# Patient Record
Sex: Male | Born: 1960 | Race: White | Hispanic: No | Marital: Married | Smoking: Former smoker
Health system: Southern US, Community
[De-identification: ages and names within clinical notes are randomized; demographics above are authoritative.]

## PROBLEM LIST (undated history)

## (undated) DIAGNOSIS — K219 Gastro-esophageal reflux disease without esophagitis: Secondary | ICD-10-CM

## (undated) DIAGNOSIS — IMO0001 Reserved for inherently not codable concepts without codable children: Secondary | ICD-10-CM

## (undated) DIAGNOSIS — E785 Hyperlipidemia, unspecified: Secondary | ICD-10-CM

## (undated) DIAGNOSIS — A4902 Methicillin resistant Staphylococcus aureus infection, unspecified site: Secondary | ICD-10-CM

## (undated) HISTORY — DX: Methicillin resistant Staphylococcus aureus infection, unspecified site: A49.02

## (undated) HISTORY — DX: Gastro-esophageal reflux disease without esophagitis: K21.9

## (undated) HISTORY — PX: LARYNGOSCOPY: SUR817

## (undated) HISTORY — DX: Hyperlipidemia, unspecified: E78.5

## (undated) HISTORY — DX: Reserved for inherently not codable concepts without codable children: IMO0001

---

## 2003-04-18 ENCOUNTER — Ambulatory Visit (HOSPITAL_COMMUNITY): Admission: RE | Admit: 2003-04-18 | Discharge: 2003-04-18 | Payer: Self-pay | Admitting: Internal Medicine

## 2004-06-19 ENCOUNTER — Ambulatory Visit (HOSPITAL_COMMUNITY): Admission: RE | Admit: 2004-06-19 | Discharge: 2004-06-19 | Payer: Self-pay | Admitting: Family Medicine

## 2006-05-27 ENCOUNTER — Encounter (INDEPENDENT_AMBULATORY_CARE_PROVIDER_SITE_OTHER): Payer: Self-pay | Admitting: Specialist

## 2006-05-27 ENCOUNTER — Ambulatory Visit: Payer: Self-pay | Admitting: Internal Medicine

## 2006-05-27 ENCOUNTER — Ambulatory Visit (HOSPITAL_COMMUNITY): Admission: RE | Admit: 2006-05-27 | Discharge: 2006-05-27 | Payer: Self-pay | Admitting: Internal Medicine

## 2007-12-24 ENCOUNTER — Ambulatory Visit (HOSPITAL_COMMUNITY): Admission: RE | Admit: 2007-12-24 | Discharge: 2007-12-24 | Payer: Self-pay | Admitting: Family Medicine

## 2010-06-04 ENCOUNTER — Ambulatory Visit (INDEPENDENT_AMBULATORY_CARE_PROVIDER_SITE_OTHER): Payer: BC Managed Care – PPO | Admitting: Internal Medicine

## 2010-06-04 DIAGNOSIS — R1310 Dysphagia, unspecified: Secondary | ICD-10-CM

## 2010-06-29 NOTE — Op Note (Signed)
NAMEAVIV, LENGACHER                ACCOUNT NO.:  [id number]   MEDICAL RECORD NO.:  [id number]          PATIENT TYPE:  AMB   LOCATION:  DAY                           FACILITY:  [facility]   PHYSICIAN:  Lionel December, M.D.    DATE OF BIRTH:  February 05, 1961   DATE OF PROCEDURE:  05/27/2006  DATE OF DISCHARGE:                               OPERATIVE REPORT   PROCEDURE:  Colonoscopy with polypectomy.   INDICATIONS:  The patient is 50 year old Caucasian male whose last  colonoscopy was in March,2005 with removal of three adenomas, one was  over a centimeter.  He is now returning for surveillance colonoscopy.  He does not have any ongoing GI symptoms.  Procedure and risks were  reviewed with the patient, and informed consent was obtained.   MEDICATIONS FOR CONSCIOUS SEDATION:  Demerol 50 mg IV, Versed 5 mg IV.   FINDINGS:  Procedure performed in endoscopy suite.  The patient's vital  signs and O2 saturation were monitored during the procedure and remained  stable.  The patient was placed in left lateral position and rectal  examination performed.  No abnormality noted on external or digital  exam.  Preparation was satisfactory.  The patient's vital signs and O2  saturation were monitored during the procedure and remained stable.  The  patient was placed in left lateral position.  Rectal examination  performed.  No abnormality noted on external or digital exam.  Pentax  videoscope was placed in rectum and advanced under vision into sigmoid  colon and beyond.  Preparation was satisfactory.  Scope was passed into  cecum which was identified by appendiceal orifice and ileocecal valve.  As the scope was with withdrawn, colonic mucosa was carefully examined.  There was a 6-mm sessile polyp at proximal transverse colon which was  snared and retrieved for histologic examination.  Mucosa of the rest of  the colon was normal.  Rectal mucosa similarly was normal.  Scope was  retroflexed to examine anorectal  junction which was unremarkable.  Endoscope was straightened and withdrawn.  The patient tolerated the  procedure well.   FINAL DIAGNOSIS:  A 6-mm sessile polyp snared from proximal transverse  colon.   The rest of the exam was normal.   RECOMMENDATIONS:  No aspirin for 1 week.  I will be contacting the patient with results of biopsy.      Lionel December, M.D.  Electronically Signed     NR/MEDQ  D:  05/27/2006  T:  05/27/2006  Job:  [id number]   cc:   [name], MD  Fax: [phone]

## 2010-06-29 NOTE — Op Note (Signed)
NAME:  Wayne [name], Wayne [name]                          ACCOUNT NO.:  [id number]   MEDICAL RECORD NO.:  [id number]                   PATIENT TYPE:  AMB   LOCATION:  DAY                                  FACILITY:  [facility]   PHYSICIAN:  Lionel December, M.D.                 DATE OF BIRTH:  06-27-1960   DATE OF PROCEDURE:  04/18/2003  DATE OF DISCHARGE:                                 OPERATIVE REPORT   PROCEDURE:  Total colonoscopy with polypectomy.   ENDOSCOPIST:  Lionel December, M.D.   INDICATIONS:  Wayne [name] is a 50 year old Caucasian male who had 2 episodes of  hematochezia recently.  He may have been constipated, but he did not have  any other symptoms.  His family history is positive for colon polyps in his  father.  He is undergoing a diagnostic colonoscopy.  The procedure and risks  were reviewed with the patient and informed consent was obtained.   PREOPERATIVE MEDICATIONS:  Demerol 25 mg IV and Versed 7 mg IV.   FINDINGS:  Procedure performed in endoscopy suite.  The patient's vital  signs and O2 saturation were monitored during the procedure and remained  stable.  The patient was placed in the left lateral recumbent position and  rectal examination was performed.  No abnormality noted on external or  digital exam.   Olympus videoscope was placed in the rectum and advanced under vision into  the sigmoid colon and beyond.  There was a large pedunculated polyp with a  hemorrhagic surface at the distal sigmoid colon which was dealt with on the  way out.  The preparation was satisfactory with a lot of liquid which had to  be suctioned out.  The scope was passed into the cecum which was identified  by appendiceal orifice and the ileocecal valve.  There was a small polyp at  the proximal transverse colon which was ablated by a cold biopsy.  There was  a 5 mm sessile polyp at the distal transverse colon which was snared and  retrieved for histologic examination.  This pedunculated polyp measured  about 12-13 mm. It was on a short, thick stalk.  This was snared and  retrieved using the Dormia basket.  There was another sessile polyp at  rectum which was snared.  The scope was retroflexed to examine the anorectal  junction which was unremarkable.   The endoscope was straightened and withdrawn.  The patient tolerated the  procedure well.   FINAL DIAGNOSIS:  Three colonic polyps snared.  The largest one was 12-to-13  mm at the distal sigmoid colon felt to be the cause of his recent  hematochezia.  The fourth polyp was small and coagulated.   RECOMMENDATIONS:  1. Standard instructions given.  2. I will be contacting the patient with biopsy results and further     recommendations.  3. Given today's findings, I am inclined to Wayne [name] him back  in 3 years, but     will defer final decision until the histology is available for review.      ___________________________________________                                            Lionel December, M.D.   NR/MEDQ  D:  04/18/2003  T:  04/18/2003  Job:  [id number]   cc:   [name], M.D.  [address], [address]  [place]  [place] [address]  Fax: [phone]

## 2010-07-05 ENCOUNTER — Ambulatory Visit (HOSPITAL_COMMUNITY)
Admission: RE | Admit: 2010-07-05 | Discharge: 2010-07-05 | Disposition: A | Discharge status: Home / Self Care | Payer: BC Managed Care – PPO | Source: Ambulatory Visit | Attending: Internal Medicine | Admitting: Internal Medicine

## 2010-07-05 ENCOUNTER — Encounter (HOSPITAL_BASED_OUTPATIENT_CLINIC_OR_DEPARTMENT_OTHER): Payer: BC Managed Care – PPO | Admitting: Internal Medicine

## 2010-07-05 DIAGNOSIS — R1311 Dysphagia, oral phase: Secondary | ICD-10-CM

## 2010-07-05 DIAGNOSIS — K219 Gastro-esophageal reflux disease without esophagitis: Secondary | ICD-10-CM

## 2010-07-05 DIAGNOSIS — K31819 Angiodysplasia of stomach and duodenum without bleeding: Secondary | ICD-10-CM

## 2010-07-05 DIAGNOSIS — K222 Esophageal obstruction: Secondary | ICD-10-CM | POA: Insufficient documentation

## 2010-07-05 DIAGNOSIS — K449 Diaphragmatic hernia without obstruction or gangrene: Secondary | ICD-10-CM | POA: Insufficient documentation

## 2010-07-05 DIAGNOSIS — E785 Hyperlipidemia, unspecified: Secondary | ICD-10-CM | POA: Insufficient documentation

## 2010-07-05 DIAGNOSIS — R1310 Dysphagia, unspecified: Secondary | ICD-10-CM | POA: Insufficient documentation

## 2010-07-05 HISTORY — PX: UPPER GASTROINTESTINAL ENDOSCOPY: SHX188

## 2010-07-30 NOTE — Op Note (Signed)
[name], [name]                ACCOUNT NO.:  [id number]  MEDICAL RECORD NO.:  [id number]           PATIENT TYPE:  O  LOCATION:  DAYP                          FACILITY:  [facility]  PHYSICIAN:  Lionel December, M.D.    DATE OF BIRTH:  1960-11-28  DATE OF PROCEDURE:  07/05/2010 DATE OF DISCHARGE:                              OPERATIVE REPORT   PROCEDURE:  Esophagogastroduodenoscopy with esophageal dilation.  INDICATIONS:  Wayne [name] is a 50 year old Caucasian male who has chronic GERD who has been on intermittent p.r.n. PPI therapy until about a month ago when he was seen in our office and advised to take omeprazole every day. He states he is not experiencing heartburn anymore.  He has had dysphagia off and on for about 2 years, is primarily to solids and he points to his low sternal area site of bolus obstruction.  He is undergoing diagnostic/therapeutic EGD.  Procedure risks were reviewed with the patient and informed consent was obtained.  MEDICATIONS FOR CONSCIOUS SEDATION: 1. Cetacaine spray for pharyngeal topical anesthesia. 2. Demerol 50 mg IV. 3. Versed 7 mg IV in divided dose.  FINDINGS:  Procedure performed in endoscopy suite.  The patient's vital signs and O2 sats were monitored during the procedure and remained stable.  The patient was placed in left lateral recumbent position and Pentax videoscope was passed through oropharynx without any difficulty into esophagus.  Esophagus.  Mucosa of the esophagus normal.  Prominent ring was noted at GE junction which was located at 35-cm from the incisors.  Hiatus was at 38.  Mucosa and herniated part of the stomach was normal.  Stomach.  It was empty and distended very well with insufflation.  Folds of the proximal stomach are normal.  Examination of mucosa at body, antrum, pyloric channel as well as angularis, fundus and cardia was normal.  Duodenum.  In the bulb, there were very fine punctate telangiectasia without stigmata of  bleeding.  No erosions or ulcers were noted.  Scope was passed into second part of the duodenal mucosa and folds were normal.  Endoscope was withdrawn.  Esophageal dilation was performed by passing 56-French Maloney dilator to full insertion.  As the dilators were withdrawn, endoscope was passed again and ring was noted to have been disrupted.  Pictures were taken for the record.  Endoscope was withdrawn.  The patient tolerated the procedure well.  FINAL DIAGNOSES: 1. Schatzki ring disrupted by passing 56-French Maloney dilator. 2. Small sliding hiatal hernia, but no evidence of erosive     esophagitis. 3. Few very fine punctate telangiectasia involving the bulbar mucosa.  RECOMMENDATIONS:  He will continue antireflux measures omeprazole at 40 mg p.o. q.a.m.  If he does well, he may consider dropping the dose to 20 mg after 6 months or so.          ______________________________ Lionel December, M.D.     NR/MEDQ  D:  07/05/2010  T:  07/06/2010  Job:  [id number]  cc:   Manson Passey, M.D. [place] [employer] A. Gerda Diss, MD Fax: [phone]  Electronically Signed by Lionel December M.D. on 07/30/2010 10:18:13 AM

## 2010-10-03 ENCOUNTER — Encounter (INDEPENDENT_AMBULATORY_CARE_PROVIDER_SITE_OTHER): Payer: Self-pay

## 2011-05-15 ENCOUNTER — Encounter (INDEPENDENT_AMBULATORY_CARE_PROVIDER_SITE_OTHER): Payer: Self-pay | Admitting: *Deleted

## 2011-12-04 ENCOUNTER — Other Ambulatory Visit: Payer: Self-pay | Admitting: Family Medicine

## 2011-12-04 ENCOUNTER — Ambulatory Visit (HOSPITAL_COMMUNITY)
Admission: RE | Admit: 2011-12-04 | Discharge: 2011-12-04 | Disposition: A | Discharge status: Home / Self Care | Payer: BC Managed Care – PPO | Source: Ambulatory Visit | Attending: Family Medicine | Admitting: Family Medicine

## 2011-12-04 DIAGNOSIS — R61 Generalized hyperhidrosis: Secondary | ICD-10-CM | POA: Insufficient documentation

## 2011-12-05 ENCOUNTER — Telehealth (INDEPENDENT_AMBULATORY_CARE_PROVIDER_SITE_OTHER): Payer: Self-pay | Admitting: *Deleted

## 2011-12-05 ENCOUNTER — Other Ambulatory Visit (INDEPENDENT_AMBULATORY_CARE_PROVIDER_SITE_OTHER): Payer: Self-pay | Admitting: *Deleted

## 2011-12-05 DIAGNOSIS — Z86010 Personal history of colonic polyps: Secondary | ICD-10-CM

## 2011-12-05 DIAGNOSIS — Z1211 Encounter for screening for malignant neoplasm of colon: Secondary | ICD-10-CM

## 2011-12-05 NOTE — Telephone Encounter (Signed)
Patient needs movi prep 

## 2011-12-06 MED ORDER — PEG-KCL-NACL-NASULF-NA ASC-C 100 G PO SOLR: 1.0000 | Freq: Once | ORAL | Status: DC

## 2012-01-01 ENCOUNTER — Telehealth (INDEPENDENT_AMBULATORY_CARE_PROVIDER_SITE_OTHER): Payer: Self-pay | Admitting: *Deleted

## 2012-01-01 NOTE — Telephone Encounter (Signed)
agree

## 2012-01-01 NOTE — Telephone Encounter (Signed)
  Procedure: tcs  Reason/Indication:  Hx polyps  Has patient had this procedure before?  yes  If so, when, by whom and where?  2008  Is there a family history of colon cancer?  no  Who?  What age when diagnosed?    Is patient diabetic?   no      Does patient have prosthetic heart valve?  no  Do you have a pacemaker?  no  Has patient had joint replacement within last 12 months?  no  Is patient on Coumadin, Plavix and/or Aspirin? no  Medications: bupropion 150 mg bid, pravachol 20 mg daily  Allergies: nkda  Medication Adjustment:   Procedure date & time: 01/29/12 at 100

## 2012-01-21 ENCOUNTER — Encounter (INDEPENDENT_AMBULATORY_CARE_PROVIDER_SITE_OTHER): Payer: Self-pay | Admitting: *Deleted

## 2012-02-20 ENCOUNTER — Telehealth (INDEPENDENT_AMBULATORY_CARE_PROVIDER_SITE_OTHER): Payer: Self-pay | Admitting: *Deleted

## 2012-02-20 NOTE — Telephone Encounter (Signed)
agree

## 2012-02-20 NOTE — Telephone Encounter (Signed)
  Procedure: tcs  Reason/Indication:  Hx polyps  Has patient had this procedure before?  yes  If so, when, by whom and where?  2008  Is there a family history of colon cancer?  no  Who?  What age when diagnosed?    Is patient diabetic?   no      Does patient have prosthetic heart valve?  no  Do you have a pacemaker?  no  Has patient had joint replacement within last 12 months?  no  Is patient on Coumadin, Plavix and/or Aspirin? no  Medications: bupropion 150 mg bid, pravachol 20 mg daily  Allergies: nkda  Medication Adjustment:   Procedure date & time: 03/20/11 at 930

## 2012-03-12 ENCOUNTER — Telehealth (INDEPENDENT_AMBULATORY_CARE_PROVIDER_SITE_OTHER): Payer: Self-pay | Admitting: *Deleted

## 2012-03-12 DIAGNOSIS — Z1211 Encounter for screening for malignant neoplasm of colon: Secondary | ICD-10-CM

## 2012-03-12 MED ORDER — PEG-KCL-NACL-NASULF-NA ASC-C 100 G PO SOLR: 1.0000 | Freq: Once | ORAL | Status: DC

## 2012-03-12 NOTE — Telephone Encounter (Signed)
Please resend, patient states pharmacy never got RX

## 2012-03-18 ENCOUNTER — Other Ambulatory Visit (INDEPENDENT_AMBULATORY_CARE_PROVIDER_SITE_OTHER): Payer: Self-pay | Admitting: *Deleted

## 2012-03-18 DIAGNOSIS — Z86010 Personal history of colonic polyps: Secondary | ICD-10-CM

## 2012-03-19 ENCOUNTER — Encounter (HOSPITAL_COMMUNITY)
Admission: RE | Disposition: A | Discharge status: Home / Self Care | Payer: Self-pay | Source: Ambulatory Visit | Attending: Internal Medicine

## 2012-03-19 ENCOUNTER — Encounter (HOSPITAL_COMMUNITY): Payer: Self-pay

## 2012-03-19 ENCOUNTER — Ambulatory Visit (HOSPITAL_COMMUNITY)
Admission: RE | Admit: 2012-03-19 | Discharge: 2012-03-19 | Disposition: A | Discharge status: Home / Self Care | Payer: BC Managed Care – PPO | Source: Ambulatory Visit | Attending: Internal Medicine | Admitting: Internal Medicine

## 2012-03-19 DIAGNOSIS — D126 Benign neoplasm of colon, unspecified: Secondary | ICD-10-CM

## 2012-03-19 DIAGNOSIS — K5730 Diverticulosis of large intestine without perforation or abscess without bleeding: Secondary | ICD-10-CM | POA: Insufficient documentation

## 2012-03-19 DIAGNOSIS — Z1211 Encounter for screening for malignant neoplasm of colon: Secondary | ICD-10-CM

## 2012-03-19 DIAGNOSIS — Z86010 Personal history of colonic polyps: Secondary | ICD-10-CM

## 2012-03-19 HISTORY — PX: COLONOSCOPY: SHX5424

## 2012-03-19 SURGERY — COLONOSCOPY
Anesthesia: Moderate Sedation

## 2012-03-19 MED ORDER — MIDAZOLAM HCL 5 MG/5ML IJ SOLN
INTRAMUSCULAR | Status: DC | PRN
  Administered 2012-03-19 (×2): 2 mg via INTRAVENOUS

## 2012-03-19 MED ORDER — MIDAZOLAM HCL 5 MG/5ML IJ SOLN
INTRAMUSCULAR | Status: AC
  Filled 2012-03-19: qty 10

## 2012-03-19 MED ORDER — SODIUM CHLORIDE 0.45 % IV SOLN
INTRAVENOUS | Status: DC
  Administered 2012-03-19: 09:00:00 via INTRAVENOUS

## 2012-03-19 MED ORDER — STERILE WATER FOR IRRIGATION IR SOLN
Status: DC | PRN
  Administered 2012-03-19: 09:00:00

## 2012-03-19 MED ORDER — MEPERIDINE HCL 50 MG/ML IJ SOLN
INTRAMUSCULAR | Status: AC
  Filled 2012-03-19: qty 1

## 2012-03-19 MED ORDER — MEPERIDINE HCL 50 MG/ML IJ SOLN
INTRAMUSCULAR | Status: DC | PRN
  Administered 2012-03-19: 25 mg via INTRAVENOUS

## 2012-03-19 NOTE — H&P (Signed)
Wayne [name] is an 52 y.o. male.   Chief Complaint: Patient is here for colonoscopy. HPI: Patient is a-year-old Caucasian male who is here for screening colonoscopy. He denies abdominal pain change in his bowel habits or rectal bleeding. Family history is negative for colorectal carcinoma. He gained 20 pounds in the last 5 months after he quit cigarette smoking and now he is trying to lose it.  Past Medical History  Diagnosis Date  . Dysphagia   . Reflux     Past Surgical History  Procedure Date  . Laryngoscopy   . Upper gastrointestinal endoscopy 07/05/2010    EGD ED    History reviewed. No pertinent family history. Social History:  does not have a smoking history on file. He does not have any smokeless tobacco history on file. His alcohol and drug histories not on file.  Allergies: No Known Allergies  Medications Prior to Admission  Medication Sig Dispense Refill  . buPROPion (WELLBUTRIN SR) 150 MG 12 hr tablet Take 150 mg by mouth 2 (two) times daily.      . halobetasol (ULTRAVATE) 0.05 % cream Apply 1 application topically 3 (three) times a week. Prn eczema flares.      . peg 3350 powder (MOVIPREP) 100 G SOLR Take 1 kit (100 g total) by mouth once.  1 kit  0  . pravastatin (PRAVACHOL) 20 MG tablet Take 20 mg by mouth daily.          No results found for this or any previous visit (from the past 48 hour(s)). No results found.  ROS  Blood pressure 102/62, pulse 76, temperature 98.6 F (37 C), temperature source Oral, resp. rate 22, height 5\' 6"  (1.676 m), weight 175 lb (79.379 kg), SpO2 99.00%. Physical Exam  Constitutional: He appears well-developed and well-nourished.  HENT:  Mouth/Throat: Oropharynx is clear and moist.  Eyes: Conjunctivae normal are normal. No scleral icterus.  Neck: No thyromegaly present.  Cardiovascular: Normal rate, regular rhythm and normal heart sounds.   Respiratory: Effort normal.  GI: Soft. He exhibits no distension and no mass. There is  no tenderness.  Musculoskeletal: He exhibits no edema.  Lymphadenopathy:    He has no cervical adenopathy.  Neurological: He is alert.  Skin: Skin is warm and dry.     Assessment/Plan Average risk screening colonoscopy.  REHMAN,NAJEEB U 03/19/2012, 9:21 AM

## 2012-03-19 NOTE — Op Note (Signed)
COLONOSCOPY PROCEDURE REPORT  PATIENT:  EDOARDO LAFORTE  MR#:  [id number] Birthdate:  May 25, 1960, 52 y.o., male Endoscopist:  Dr. Malissa Hippo, MD Referred By:  Dr. Lilyan Punt, MD  Procedure Date: 03/19/2012  Procedure:   Colonoscopy  Indications:  Patient is 52 year old Caucasian male was undergoing average risk screening colonoscopy.  Informed Consent:  The procedure and risks were reviewed with the patient and informed consent was obtained.  Medications:  Demerol 25 mg IV Versed 4 mg IV  Description of procedure:  After a digital rectal exam was performed, that colonoscope was advanced from the anus through the rectum and colon to the area of the cecum, ileocecal valve and appendiceal orifice. The cecum was deeply intubated. These structures were well-seen and photographed for the record. From the level of the cecum and ileocecal valve, the scope was slowly and cautiously withdrawn. The mucosal surfaces were carefully surveyed utilizing scope tip to flexion to facilitate fold flattening as needed. The scope was pulled down into the rectum where a thorough exam including retroflexion was performed.  Findings:   Prep satisfactory. Three small polyps at hepatic flexure. These are ablated via cold biopsy and submitted together. Few small diverticula at sigmoid colon. Normal rectal mucosa and anal rectal junction.  Therapeutic/Diagnostic Maneuvers Performed:  See above  Complications:  None  Cecal Withdrawal Time:  13  minutes  Impression:  Examination performed to cecum. Three small polyps ablated via cold biopsy from hepatic flexure and submitted together. Mild sigmoid colon diverticulosis.  Recommendations:  Standard instructions given. I will contact patient with biopsy results and further recommendations.  Damiana Berrian U  03/19/2012 10:03 AM  CC: Dr. Lilyan Punt, MD & Dr. Bonnetta Barry ref. provider found

## 2012-03-24 ENCOUNTER — Encounter (HOSPITAL_COMMUNITY): Payer: Self-pay | Admitting: Internal Medicine

## 2012-03-30 ENCOUNTER — Encounter (INDEPENDENT_AMBULATORY_CARE_PROVIDER_SITE_OTHER): Payer: Self-pay | Admitting: *Deleted

## 2012-05-11 ENCOUNTER — Ambulatory Visit (INDEPENDENT_AMBULATORY_CARE_PROVIDER_SITE_OTHER): Payer: BC Managed Care – PPO | Admitting: Family Medicine

## 2012-05-11 ENCOUNTER — Encounter: Payer: Self-pay | Admitting: Family Medicine

## 2012-05-11 VITALS — BP 112/80 | Temp 99.0°F | Ht 66.0 in | Wt 175.6 lb

## 2012-05-11 DIAGNOSIS — E785 Hyperlipidemia, unspecified: Secondary | ICD-10-CM

## 2012-05-11 DIAGNOSIS — J0111 Acute recurrent frontal sinusitis: Secondary | ICD-10-CM

## 2012-05-11 DIAGNOSIS — J0110 Acute frontal sinusitis, unspecified: Secondary | ICD-10-CM

## 2012-05-11 MED ORDER — AZITHROMYCIN 250 MG PO TABS: ORAL_TABLET | ORAL | Status: DC

## 2012-05-11 NOTE — Progress Notes (Signed)
  Subjective:    Patient ID: Wayne [name], male    DOB: 09-27-1960, 52 y.o.   MRN: [id number]  Sinusitis This is a new problem. The current episode started in the past 7 days. The problem has been gradually worsening since onset. There has been no fever. The pain is mild. Associated symptoms include congestion, coughing, sinus pressure and a sore throat. Pertinent negatives include no ear pain, headaches or neck pain. (Myalgias) Past treatments include nothing. The treatment provided no relief.      Review of Systems  Constitutional: Negative for fever, activity change and appetite change.  HENT: Positive for congestion, sore throat and sinus pressure. Negative for ear pain, rhinorrhea and neck pain.   Eyes: Negative for discharge.  Respiratory: Positive for cough. Negative for wheezing.   Cardiovascular: Negative for chest pain.  Gastrointestinal: Negative for vomiting and abdominal pain.  Skin: Negative for rash.  Allergic/Immunologic: Negative for environmental allergies and food allergies.  Neurological: Negative for weakness and headaches.  Psychiatric/Behavioral: Negative for agitation.       Objective:   Physical Exam  Vital signs were noted eardrums normal sinus mild frontal sinus tenderness naris and normal throat normal makes membranes moist neck supple lungs clear no wheezes no respiratory distress      Assessment & Plan:  Acute recurrent frontal sinusitis - Plan: azithromycin (ZITHROMAX Z-PAK) 250 MG tablet

## 2012-05-11 NOTE — Patient Instructions (Signed)

## 2012-07-20 ENCOUNTER — Encounter: Payer: Self-pay | Admitting: Family Medicine

## 2012-07-20 ENCOUNTER — Ambulatory Visit (INDEPENDENT_AMBULATORY_CARE_PROVIDER_SITE_OTHER): Payer: BC Managed Care – PPO | Admitting: Family Medicine

## 2012-07-20 VITALS — BP 110/80 | Temp 98.3°F | Ht 66.0 in | Wt 180.1 lb

## 2012-07-20 DIAGNOSIS — L0390 Cellulitis, unspecified: Secondary | ICD-10-CM

## 2012-07-20 MED ORDER — DOXYCYCLINE HYCLATE 100 MG PO TABS: 100.0000 mg | ORAL_TABLET | Freq: Two times a day (BID) | ORAL | Status: DC

## 2012-07-20 NOTE — Patient Instructions (Signed)
Take all the antibiotics. Report any worsening or fever

## 2012-07-20 NOTE — Progress Notes (Signed)
  Subjective:    Patient ID: Wayne [name], male    DOB: [birth year], 52 y.o.   MRN: [id number]  Toe Pain   Rash This is a chronic problem. The current episode started more than 1 year ago. The problem has been rapidly worsening since onset. The affected locations include the left foot. The rash is characterized by pain, redness and swelling. Past treatments include topical steroids. The treatment provided no relief.   Now patient has had progressive pain in the foot. Started as a crack between scan. Told in the past that he has chronic eczema. No fever or chills. Prior medical history significant for past MRSA infection.   Review of Systems  Skin: Positive for rash.   ROS otherwise negative     Objective:   Physical Exam Alert no acute distress. Vitals reviewed. Lungs clear heart regular rate and rhythm. Feet changes of chronic eczema and noted. Maceration and crack between scan of fourth and fifth toe secondary inflammation erythema tenderness.       Assessment & Plan:  Impression cellulitis at site of eczema. Positive history of MRSA. Dyshidrotic eczema changes lateral foot Question fungal component. Plan Doxy 100 twice a day 10 days. Local measures discussed. Maintain clobetasol. To followup in August if maceration and chronic changes persist may need antifungal regimen.

## 2012-07-24 ENCOUNTER — Telehealth: Payer: Self-pay | Admitting: Family Medicine

## 2012-07-24 ENCOUNTER — Ambulatory Visit (INDEPENDENT_AMBULATORY_CARE_PROVIDER_SITE_OTHER): Payer: BC Managed Care – PPO | Admitting: Family Medicine

## 2012-07-24 ENCOUNTER — Encounter: Payer: Self-pay | Admitting: Family Medicine

## 2012-07-24 VITALS — BP 100/70 | Temp 97.6°F | Ht 66.0 in | Wt 182.2 lb

## 2012-07-24 DIAGNOSIS — L0390 Cellulitis, unspecified: Secondary | ICD-10-CM

## 2012-07-24 MED ORDER — SULFAMETHOXAZOLE-TRIMETHOPRIM 800-160 MG PO TABS: 1.0000 | ORAL_TABLET | Freq: Two times a day (BID) | ORAL | Status: AC

## 2012-07-24 NOTE — Telephone Encounter (Signed)
Was seen on Monday July 20, 2012 regarding a infection on his left foot.  States this situation seems to be worse today.  States entire foot is swollen, red and sore.  Patient would like to know if he needs to be seen again today or does his antibiotic need to be changed.  Informed that he is off work today and to call him as soon as possible to let him know if he needs to make an appointment to be seen.

## 2012-07-24 NOTE — Progress Notes (Signed)
  Subjective:    Patient ID: Wayne [name], male    DOB: 1960-04-19, 52 y.o.   MRN: [id number]  HPI Patient arrives office for reevaluation. Please see prior note. He has now been on the Doxy for several days. Notes that the pain appears to be worsening. Notes swelling redness and tenderness. Compliant with his medicines. Elevating his foot when he can, but he does have to work the next few days.   Review of Systems No fever, no chills, no known injury, no rash elsewhere ROS otherwise negative    Objective:   Physical Exam  Alert no major distress. Vitals stable. Lungs clear. Heart regular in rhythm. Foot inflamed tender warm red minimal change from several days ago.      Assessment & Plan:  Impression 1 cellulitis persistent. Patient notes history of MRSA, wonders if this could be a resistant organism. Plan add Bactrim DS twice a day 10 days. Local measures discussed. At followup if still appears to have tenia pedis in the intertrigo regions, may well want to consider an oral agent for that. WSL

## 2012-07-24 NOTE — Patient Instructions (Addendum)
Consider oral med for chronic fungal infxn of the feet such as lamisil

## 2012-07-24 NOTE — Telephone Encounter (Signed)
Patient scheduled office visit today for recheck 

## 2012-07-24 NOTE — Telephone Encounter (Signed)
Office visit scheduled for follow up

## 2012-08-26 ENCOUNTER — Encounter: Payer: Self-pay | Admitting: Family Medicine

## 2012-08-26 ENCOUNTER — Ambulatory Visit (INDEPENDENT_AMBULATORY_CARE_PROVIDER_SITE_OTHER): Payer: BC Managed Care – PPO | Admitting: Family Medicine

## 2012-08-26 VITALS — BP 114/70 | Temp 98.1°F | Wt 183.0 lb

## 2012-08-26 DIAGNOSIS — H1045 Other chronic allergic conjunctivitis: Secondary | ICD-10-CM

## 2012-08-26 DIAGNOSIS — M76899 Other specified enthesopathies of unspecified lower limb, excluding foot: Secondary | ICD-10-CM

## 2012-08-26 DIAGNOSIS — H1013 Acute atopic conjunctivitis, bilateral: Secondary | ICD-10-CM

## 2012-08-26 DIAGNOSIS — M7071 Other bursitis of hip, right hip: Secondary | ICD-10-CM

## 2012-08-26 DIAGNOSIS — L0390 Cellulitis, unspecified: Secondary | ICD-10-CM

## 2012-08-26 DIAGNOSIS — L0291 Cutaneous abscess, unspecified: Secondary | ICD-10-CM

## 2012-08-26 MED ORDER — DICLOFENAC SODIUM 75 MG PO TBEC: 75.0000 mg | DELAYED_RELEASE_TABLET | Freq: Two times a day (BID) | ORAL | Status: DC

## 2012-08-26 MED ORDER — DOXYCYCLINE HYCLATE 100 MG PO TBEC: 100.0000 mg | DELAYED_RELEASE_TABLET | Freq: Two times a day (BID) | ORAL | Status: DC

## 2012-08-26 MED ORDER — SULFAMETHOXAZOLE-TMP DS 800-160 MG PO TABS: 1.0000 | ORAL_TABLET | Freq: Two times a day (BID) | ORAL | Status: DC

## 2012-08-26 NOTE — Progress Notes (Signed)
  Subjective:    Patient ID: Wayne [name], male    DOB: 1961-01-05, 52 y.o.   MRN: [id number]  HPI Toe cleared up. See prior notes.  Feeling tired and achey. Hips hurting for two or three weeks. Acted up after driving long distance. Patient recalls no sudden injury.  Had bug bite. Developed subsequent itching pain and swelling. Eyes the swelling considerably.  Hx of MRSA. Worse las few days . Left leg lesion red and tender.  Eyes puffy, no upper resp symptoms.    Review of Systems No fever or chills, somewhat achy, diminished energy otherwise negative.    Objective:   Physical Exam Alert TMs normal eyes sclera injected eyelids puffy. Pharynx normal neck supple. Lungs clear. Heart regular in rhythm. Low back nontender. Posterior hip tenderness to deep palpation negative straight leg raise good rotation internally and externally. Left leg 3 small insect bites the middle one is inflamed tender or draining       Assessment & Plan:  Impression probable MRSA cellulitis post bites. #2 posterior hip bursitis discussed. #3 probable high edema secondary to allergic reaction. Plan Claritin daily. Visine drops when necessary. Voltaren 75 twice a day. Local measures discussed. Bactrim DS and doxycycline prescribed. Symptomatic care discussed. WSL

## 2012-08-26 NOTE — Patient Instructions (Signed)
Claritin 10mg  OTC and Visine drops

## 2012-08-29 ENCOUNTER — Inpatient Hospital Stay (HOSPITAL_COMMUNITY)
Admission: EM | Admit: 2012-08-29 | Discharge: 2012-08-31 | DRG: 563 | Disposition: A | Discharge status: Home / Self Care | Payer: BC Managed Care – PPO | Attending: Internal Medicine | Admitting: Internal Medicine

## 2012-08-29 ENCOUNTER — Encounter (HOSPITAL_COMMUNITY): Payer: Self-pay

## 2012-08-29 DIAGNOSIS — N179 Acute kidney failure, unspecified: Secondary | ICD-10-CM | POA: Diagnosis present

## 2012-08-29 DIAGNOSIS — L03116 Cellulitis of left lower limb: Secondary | ICD-10-CM | POA: Diagnosis present

## 2012-08-29 DIAGNOSIS — Z87891 Personal history of nicotine dependence: Secondary | ICD-10-CM

## 2012-08-29 DIAGNOSIS — K219 Gastro-esophageal reflux disease without esophagitis: Secondary | ICD-10-CM | POA: Diagnosis present

## 2012-08-29 DIAGNOSIS — S90569A Insect bite (nonvenomous), unspecified ankle, initial encounter: Secondary | ICD-10-CM | POA: Diagnosis present

## 2012-08-29 DIAGNOSIS — Z79899 Other long term (current) drug therapy: Secondary | ICD-10-CM

## 2012-08-29 DIAGNOSIS — L02419 Cutaneous abscess of limb, unspecified: Principal | ICD-10-CM | POA: Diagnosis present

## 2012-08-29 DIAGNOSIS — E785 Hyperlipidemia, unspecified: Secondary | ICD-10-CM

## 2012-08-29 DIAGNOSIS — L02416 Cutaneous abscess of left lower limb: Secondary | ICD-10-CM

## 2012-08-29 DIAGNOSIS — L03119 Cellulitis of unspecified part of limb: Principal | ICD-10-CM | POA: Diagnosis present

## 2012-08-29 LAB — CBC WITH DIFFERENTIAL/PLATELET
Basophils Absolute: 0 K/uL (ref 0.0–0.1)
Basophils Relative: 0 % (ref 0–1)
Eosinophils Absolute: 0.1 K/uL (ref 0.0–0.7)
Eosinophils Relative: 2 % (ref 0–5)
HCT: 39.9 % (ref 39.0–52.0)
Hemoglobin: 13.9 g/dL (ref 13.0–17.0)
Lymphocytes Relative: 15 % (ref 12–46)
Lymphs Abs: 0.7 K/uL (ref 0.7–4.0)
MCH: 30.3 pg (ref 26.0–34.0)
MCHC: 34.8 g/dL (ref 30.0–36.0)
MCV: 86.9 fL (ref 78.0–100.0)
Monocytes Absolute: 0.6 K/uL (ref 0.1–1.0)
Monocytes Relative: 12 % (ref 3–12)
Neutro Abs: 3.5 K/uL (ref 1.7–7.7)
Neutrophils Relative %: 71 % (ref 43–77)
Platelets: 190 K/uL (ref 150–400)
RBC: 4.59 MIL/uL (ref 4.22–5.81)
RDW: 13 % (ref 11.5–15.5)
WBC: 4.9 K/uL (ref 4.0–10.5)

## 2012-08-29 LAB — MRSA PCR SCREENING: MRSA by PCR: NEGATIVE

## 2012-08-29 LAB — BASIC METABOLIC PANEL WITH GFR
BUN: 21 mg/dL (ref 6–23)
CO2: 28 meq/L (ref 19–32)
Calcium: 9.4 mg/dL (ref 8.4–10.5)
Chloride: 102 meq/L (ref 96–112)
Creatinine, Ser: 1.4 mg/dL — ABNORMAL HIGH (ref 0.50–1.35)
GFR calc Af Amer: 65 mL/min — ABNORMAL LOW
GFR calc non Af Amer: 56 mL/min — ABNORMAL LOW
Glucose, Bld: 89 mg/dL (ref 70–99)
Potassium: 3.9 meq/L (ref 3.5–5.1)
Sodium: 139 meq/L (ref 135–145)

## 2012-08-29 MED ORDER — VANCOMYCIN HCL IN DEXTROSE 1-5 GM/200ML-% IV SOLN
1000.0000 mg | Freq: Once | INTRAVENOUS | Status: AC
  Administered 2012-08-29: 1000 mg via INTRAVENOUS
  Filled 2012-08-29: qty 200

## 2012-08-29 MED ORDER — BUPROPION HCL ER (SR) 150 MG PO TB12
ORAL_TABLET | ORAL | Status: AC
  Filled 2012-08-29: qty 1

## 2012-08-29 MED ORDER — ONDANSETRON HCL 4 MG PO TABS: 4.0000 mg | ORAL_TABLET | Freq: Four times a day (QID) | ORAL | Status: DC | PRN

## 2012-08-29 MED ORDER — LORATADINE 10 MG PO TABS
10.0000 mg | ORAL_TABLET | Freq: Every day | ORAL | Status: DC
  Administered 2012-08-30 – 2012-08-31 (×2): 10 mg via ORAL
  Filled 2012-08-29 (×3): qty 1

## 2012-08-29 MED ORDER — SODIUM CHLORIDE 0.9 % IV SOLN
INTRAVENOUS | Status: DC
  Administered 2012-08-29 – 2012-08-31 (×3): via INTRAVENOUS

## 2012-08-29 MED ORDER — VANCOMYCIN HCL IN DEXTROSE 1-5 GM/200ML-% IV SOLN
INTRAVENOUS | Status: AC
  Filled 2012-08-29: qty 200

## 2012-08-29 MED ORDER — ONDANSETRON HCL 4 MG/2ML IJ SOLN: 4.0000 mg | Freq: Four times a day (QID) | INTRAMUSCULAR | Status: DC | PRN

## 2012-08-29 MED ORDER — ACETAMINOPHEN 325 MG PO TABS: 650.0000 mg | ORAL_TABLET | Freq: Four times a day (QID) | ORAL | Status: DC | PRN

## 2012-08-29 MED ORDER — ENOXAPARIN SODIUM 40 MG/0.4ML ~~LOC~~ SOLN
40.0000 mg | SUBCUTANEOUS | Status: DC
  Administered 2012-08-29 – 2012-08-30 (×2): 40 mg via SUBCUTANEOUS
  Filled 2012-08-29 (×3): qty 0.4

## 2012-08-29 MED ORDER — PIPERACILLIN-TAZOBACTAM 3.375 G IVPB
INTRAVENOUS | Status: AC
  Filled 2012-08-29: qty 100

## 2012-08-29 MED ORDER — PIPERACILLIN-TAZOBACTAM 3.375 G IVPB
3.3750 g | Freq: Three times a day (TID) | INTRAVENOUS | Status: DC
  Administered 2012-08-29 – 2012-08-31 (×6): 3.375 g via INTRAVENOUS
  Filled 2012-08-29 (×9): qty 50

## 2012-08-29 MED ORDER — SIMVASTATIN 10 MG PO TABS
10.0000 mg | ORAL_TABLET | Freq: Every day | ORAL | Status: DC
  Administered 2012-08-29 – 2012-08-30 (×2): 10 mg via ORAL
  Filled 2012-08-29 (×2): qty 1

## 2012-08-29 MED ORDER — BUPROPION HCL ER (SR) 150 MG PO TB12
150.0000 mg | ORAL_TABLET | Freq: Two times a day (BID) | ORAL | Status: DC
  Administered 2012-08-29 – 2012-08-31 (×4): 150 mg via ORAL
  Filled 2012-08-29 (×8): qty 1

## 2012-08-29 MED ORDER — VANCOMYCIN HCL IN DEXTROSE 1-5 GM/200ML-% IV SOLN
1000.0000 mg | Freq: Two times a day (BID) | INTRAVENOUS | Status: DC
  Administered 2012-08-30 – 2012-08-31 (×3): 1000 mg via INTRAVENOUS
  Filled 2012-08-29 (×5): qty 200

## 2012-08-29 MED ORDER — ACETAMINOPHEN 650 MG RE SUPP: 650.0000 mg | Freq: Four times a day (QID) | RECTAL | Status: DC | PRN

## 2012-08-29 NOTE — Progress Notes (Signed)
Dr. Kerry Hough notified via text page that patient had arrived on unit to room 328.
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
## 2012-08-29 NOTE — ED Notes (Signed)
Pt states he has an infection to his left lower leg. Was evaluated by Dr. Gerda Diss on Wednesday on given doxy and bactrim. States leg is getting worse
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
## 2012-08-30 LAB — BASIC METABOLIC PANEL
BUN: 16 mg/dL (ref 6–23)
CO2: 28 mEq/L (ref 19–32)
Calcium: 9 mg/dL (ref 8.4–10.5)
Chloride: 103 mEq/L (ref 96–112)
Creatinine, Ser: 1.43 mg/dL — ABNORMAL HIGH (ref 0.50–1.35)
Glucose, Bld: 116 mg/dL — ABNORMAL HIGH (ref 70–99)
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
## 2012-08-30 NOTE — Progress Notes (Signed)
[facility] PROGRESS NOTE  SHAFIQ LARCH MVH:[id number] DOB: July 24, 1960 DOA: 08/29/2012 PCP: Lilyan Punt, MD  Assessment/Plan: 1. Cellulitis. We'll continue with current antibiotics. He continues to have significant erythema, calor in his left lower leg. We'll continue current therapy and observe. 2. Acute renal failure. Creatinine remains 1.4. His pain to be a chronic process. We'll continue fluids and observe.  3. Hyperlipidemia. Continue statin  Code Status: Full code Family Communication: Discussed with patient  Disposition Plan: Discharge home once improved   Consultants:  None  Procedures:  None  Antibiotics:  Vancomycin 7/19  Zosyn 7/19   HPI/Subjective: Feels that leg is a little better today. No new complaints  Objective: Filed Vitals:   08/29/12 1726 08/29/12 2148 08/30/12 0516 08/30/12 1401  BP: 105/70 107/65 96/63 103/68  Pulse: 78 87 82 80  Temp: 97.9 F (36.6 C) 98.1 F (36.7 C) 98.1 F (36.7 C) 98.5 F (36.9 C)  TempSrc: Oral     Resp: 20 20 18 20   Height: 5\' 6"  (1.676 m)     Weight: 83.008 kg (183 lb)     SpO2: 97% 97% 98% 97%    Intake/Output Summary (Last 24 hours) at 08/30/12 1903 Last data filed at 08/30/12 0513  Gross per 24 hour  Intake 1645.83 ml  Output      0 ml  Net 1645.83 ml   Filed Weights   08/29/12 1340 08/29/12 1726  Weight: 83.008 kg (183 lb) 83.008 kg (183 lb)    Exam:   General:  No acute distress  Cardiovascular: S1, S2, regular rate and rhythm  Respiratory: Clear to auscultation bilaterally  Abdomen: Soft, nontender, positive bowel sounds  Musculoskeletal: No pedal edema bilaterally   Skin: Continued erythema on left lower leg around the wound. Serous drainage noted from wound. No purulent drainage expressed.  Data Reviewed: Basic Metabolic Panel:  Recent Labs Lab 08/29/12 1444 08/30/12 0618  NA 139 138  K 3.9 4.4  CL 102 103  CO2 28 28  GLUCOSE 89 116*  BUN 21 16  CREATININE 1.40*  1.43*  CALCIUM 9.4 9.0   Liver Function Tests: No results found for this basename: AST, ALT, ALKPHOS, BILITOT, PROT, ALBUMIN,  in the last 168 hours No results found for this basename: LIPASE, AMYLASE,  in the last 168 hours No results found for this basename: AMMONIA,  in the last 168 hours CBC:  Recent Labs Lab 08/29/12 1444  WBC 4.9  NEUTROABS 3.5  HGB 13.9  HCT 39.9  MCV 86.9  PLT 190   Cardiac Enzymes: No results found for this basename: CKTOTAL, CKMB, CKMBINDEX, TROPONINI,  in the last 168 hours BNP (last 3 results) No results found for this basename: PROBNP,  in the last 8760 hours CBG: No results found for this basename: GLUCAP,  in the last 168 hours  Recent Results (from the past 240 hour(s))  MRSA PCR SCREENING     Status: None   Collection Time    08/29/12  7:39 PM      Result Value Range Status   MRSA by PCR NEGATIVE  NEGATIVE Final   Comment:            The GeneXpert MRSA Assay (FDA     approved for NASAL specimens     only), is one component of a     comprehensive MRSA colonization     surveillance program. It is not     intended to diagnose MRSA     infection nor  to guide or     monitor treatment for     MRSA infections.     Studies: No results found.  Scheduled Meds: . buPROPion  150 mg Oral BID  . enoxaparin (LOVENOX) injection  40 mg Subcutaneous Q24H  . loratadine  10 mg Oral Daily  . piperacillin-tazobactam (ZOSYN)  IV  3.375 g Intravenous Q8H  . simvastatin  10 mg Oral q1800  . vancomycin  1,000 mg Intravenous Q12H   Continuous Infusions: . sodium chloride 125 mL/hr at 08/30/12 1047    Active Problems:   Other and unspecified hyperlipidemia   Cellulitis of left lower leg   Acute renal failure    Time spent: 25 minutes    Talicia Sui  [facility] Pager [phone]. If 7PM-7AM, please contact night-coverage at [email or web address], password [facility] 08/30/2012, 7:03 PM  LOS: 1 day

## 2012-08-31 LAB — BASIC METABOLIC PANEL
BUN: 15 mg/dL (ref 6–23)
CO2: 27 mEq/L (ref 19–32)
Chloride: 105 mEq/L (ref 96–112)
Creatinine, Ser: 1.43 mg/dL — ABNORMAL HIGH (ref 0.50–1.35)
Glucose, Bld: 107 mg/dL — ABNORMAL HIGH (ref 70–99)

## 2012-08-31 MED ORDER — SACCHAROMYCES BOULARDII 250 MG PO CAPS: 250.0000 mg | ORAL_CAPSULE | Freq: Two times a day (BID) | ORAL | Status: DC

## 2012-08-31 MED ORDER — CLINDAMYCIN HCL 300 MG PO CAPS: 300.0000 mg | ORAL_CAPSULE | Freq: Four times a day (QID) | ORAL | Status: DC

## 2012-08-31 MED ORDER — ACETAMINOPHEN 500 MG PO TABS: 500.0000 mg | ORAL_TABLET | Freq: Four times a day (QID) | ORAL | Status: DC | PRN

## 2012-08-31 NOTE — Progress Notes (Signed)
Patient discharged home instructions were given on discharge medications and follow up visits,patient verbalized understanding. Handout given on cellulitis,know signs,and symptoms,also when to seek medical care. Prescriptions sent with patient. No c/o pain or discomfort noted. Accompanied by staff to an awaiting vehicle.

## 2012-08-31 NOTE — Discharge Summary (Signed)
Physician Discharge Summary  Wayne [name]:[id number] DOB: 09/19/1960 DOA: 08/29/2012  PCP: Lilyan Punt, MD  Admit date: 08/29/2012 Discharge date: 08/31/2012  Time spent: 40 minutes  Recommendations for Outpatient Follow-up:  1. PCP in 1 week for evaluation of symptoms. Recommend BMET for evaluation of renal function.  Discharge Diagnoses:  Active Problems:   Other and unspecified hyperlipidemia   Cellulitis of left lower leg   Acute renal failure   Discharge Condition: stable  Diet recommendation: regular  Filed Weights   08/29/12 1340 08/29/12 1726  Weight: 183 lb (83.008 kg) 183 lb (83.008 kg)    History of present illness:  Wayne [name] is a 52 y.o. male with past medical history of hyperlipidemia who presented to the emergency room with worsening cellulitis on 08/29/12. Patient reported that approximately 5 days prior to admission he noticed a small area on his left shin which was erythematous, draining a small amount of fluid. He went to his primary care physician and was started on a course of doxycycline and Bactrim. He was taking these antibiotics but noticed that his infection had progressively gotten worse. His edema started to track up his leg, his wound became increasingly large and was draining purulent fluid. He denied any fevers at home. He denied any trauma to the area. He did report having several insect bites to his left leg. Since he had failed outpatient antibiotic therapy, he'd been referred for admission.      Hospital Course:  Cellulitis. Patient has prior history of MRSA cellulitis. He had failed outpatient antibiotic therapy. He was started empirically on vancomycin and Zosyn. On day of discharge led with much less erythema and swelling. Will discharge on oral clindamycin for 7 days. Will instruct pt to keep lower extremity elevated as much as possible.  There was no further purulent drainage during this hospitalization. There was a moderate amount  expressed during exam in ED on admission.    Acute renal failure. Creatinine is 1.4. He does take significant nonsteroidal anti-inflammatory drugs. He was also on Bactrim. These were held and he received IV fluids. On discharge creatinine remained at 1.4. May be a chronic component. Will recommend bmet in 1 week with PCP for tracking.    Hyperlipidemia. Continue statin   Procedures:    Consultations:  none  Discharge Exam: Filed Vitals:   08/30/12 0516 08/30/12 1401 08/30/12 2200 08/31/12 0520  BP: 96/63 103/68 109/68 96/60  Pulse: 82 80 80 70  Temp: 98.1 F (36.7 C) 98.5 F (36.9 C) 97.3 F (36.3 C) 97.5 F (36.4 C)  TempSrc:   Oral Oral  Resp: 18 20 20 20   Height:      Weight:      SpO2: 98% 97% 95% 98%    General: well nourished, non-toxic NAD Cardiovascular: RRR No MGR  Respiratory: normal effort BS clear bilaterally no wheeze Skin: left leg wound with mild erythema and mild edema and warmth. Improved from admission. Able to ambulate without problem  Discharge Instructions     Medication List    STOP taking these medications       doxycycline 100 MG EC tablet  Commonly known as:  DORYX     sulfamethoxazole-trimethoprim 800-160 MG per tablet  Commonly known as:  BACTRIM DS      TAKE these medications       ALEVE 220 MG Caps  Generic drug:  Naproxen Sodium  Take 2 capsules by mouth 2 (two) times daily as needed.  buPROPion 150 MG 12 hr tablet  Commonly known as:  WELLBUTRIN SR  Take 150 mg by mouth 2 (two) times daily.     diclofenac 75 MG EC tablet  Commonly known as:  VOLTAREN  Take 1 tablet (75 mg total) by mouth 2 (two) times daily.     halobetasol 0.05 % cream  Commonly known as:  ULTRAVATE  Apply 1 application topically 3 (three) times a week. Prn eczema flares.     loratadine 10 MG tablet  Commonly known as:  CLARITIN  Take 10 mg by mouth daily.     pravastatin 20 MG tablet  Commonly known as:  PRAVACHOL  Take 20 mg by mouth at  bedtime.       No Known Allergies    The results of significant diagnostics from this hospitalization (including imaging, microbiology, ancillary and laboratory) are listed below for reference.    Significant Diagnostic Studies: No results found.  Microbiology: Recent Results (from the past 240 hour(s))  MRSA PCR SCREENING     Status: None   Collection Time    08/29/12  7:39 PM      Result Value Range Status   MRSA by PCR NEGATIVE  NEGATIVE Final   Comment:            The GeneXpert MRSA Assay (FDA     approved for NASAL specimens     only), is one component of a     comprehensive MRSA colonization     surveillance program. It is not     intended to diagnose MRSA     infection nor to guide or     monitor treatment for     MRSA infections.     Labs: Basic Metabolic Panel:  Recent Labs Lab 08/29/12 1444 08/30/12 0618 08/31/12 0500  NA 139 138 139  K 3.9 4.4 4.5  CL 102 103 105  CO2 28 28 27   GLUCOSE 89 116* 107*  BUN 21 16 15   CREATININE 1.40* 1.43* 1.43*  CALCIUM 9.4 9.0 9.0   Liver Function Tests: No results found for this basename: AST, ALT, ALKPHOS, BILITOT, PROT, ALBUMIN,  in the last 168 hours No results found for this basename: LIPASE, AMYLASE,  in the last 168 hours No results found for this basename: AMMONIA,  in the last 168 hours CBC:  Recent Labs Lab 08/29/12 1444  WBC 4.9  NEUTROABS 3.5  HGB 13.9  HCT 39.9  MCV 86.9  PLT 190   Cardiac Enzymes: No results found for this basename: CKTOTAL, CKMB, CKMBINDEX, TROPONINI,  in the last 168 hours BNP: BNP (last 3 results) No results found for this basename: PROBNP,  in the last 8760 hours CBG: No results found for this basename: GLUCAP,  in the last 168 hours     Signed:  Gwenyth Bender  [facility] 08/31/2012, 11:32 AM  Attending note:  Patient seen and examined. A note reviewed. The cellulitis appears to be improving with empiric antibiotics. He's been afebrile has a normal  WBC count. Erythema is also resolving. He's not had any purulent drainage from his wound. We will transition him to oral clindamycin to complete the antibiotic course. He'll followup with his primary care physician in one week to recheck the wound/cellulitis. Patient was also noted to have elevated creatinine at 1.4. This was initially felt to be in acute renal failure. He was taking multiple NSAID's as well as Bactrim. These were held while he was in the hospital and creatinine  has remained at 1.4. This may be a more chronic process. Recommend followup with primary care physician for repeat basic metabolic panel and referral to nephrologist if felt appropriate. Would avoid nephrotoxic agents.  Akyah Lagrange

## 2012-08-31 NOTE — Progress Notes (Signed)
Utilization Review Complete  

## 2012-09-04 ENCOUNTER — Encounter: Payer: Self-pay | Admitting: Family Medicine

## 2012-09-04 ENCOUNTER — Ambulatory Visit (INDEPENDENT_AMBULATORY_CARE_PROVIDER_SITE_OTHER): Payer: BC Managed Care – PPO | Admitting: Family Medicine

## 2012-09-04 VITALS — BP 102/62 | Temp 98.2°F | Wt 186.4 lb

## 2012-09-04 DIAGNOSIS — Z79899 Other long term (current) drug therapy: Secondary | ICD-10-CM

## 2012-09-04 DIAGNOSIS — N289 Disorder of kidney and ureter, unspecified: Secondary | ICD-10-CM

## 2012-09-04 DIAGNOSIS — Z0000 Encounter for general adult medical examination without abnormal findings: Secondary | ICD-10-CM

## 2012-09-04 DIAGNOSIS — E785 Hyperlipidemia, unspecified: Secondary | ICD-10-CM

## 2012-09-04 MED ORDER — CLINDAMYCIN HCL 300 MG PO CAPS: 300.0000 mg | ORAL_CAPSULE | Freq: Four times a day (QID) | ORAL | Status: DC

## 2012-09-04 NOTE — Progress Notes (Signed)
  Subjective:    Patient ID: Wayne [name], male    DOB: 14-May-1960, 52 y.o.   MRN: [id number]  HPI  Patient arrives to follow up after recent hospitalization for cellulitis of the left leg. Doing much better. Patient relates that he was out of work with this abscess. They were treated the cellulitis with antibiotics still on clindamycin. Tolerating it well. Using probiotic. Not having any major problems. PMH benign he also he did have renal insufficiency while he was in the hospital. This needs to be rechecked he denies excessive urination bloody urination fever chills or abdominal pain past medical history benign family history benign Review of Systems    see above Objective:   Physical Exam Blood pressure good neck no masses lungs clear hearts regular abdomen soft cellulitis noted on left lower leg seems to be healing       Assessment & Plan:  Cellulitis resolving finish clindamycin do additional 7 days if ongoing troubles Renal insufficiency check metabolic 7 along with other labs in about 2-3 weeks with followup wellness checkup

## 2012-09-10 DIAGNOSIS — Z0289 Encounter for other administrative examinations: Secondary | ICD-10-CM

## 2012-09-14 ENCOUNTER — Ambulatory Visit (INDEPENDENT_AMBULATORY_CARE_PROVIDER_SITE_OTHER): Payer: BC Managed Care – PPO | Admitting: Family Medicine

## 2012-09-14 ENCOUNTER — Encounter: Payer: Self-pay | Admitting: Family Medicine

## 2012-09-14 VITALS — BP 128/62 | Temp 98.8°F | Ht 66.0 in | Wt 184.0 lb

## 2012-09-14 DIAGNOSIS — L03119 Cellulitis of unspecified part of limb: Secondary | ICD-10-CM

## 2012-09-14 DIAGNOSIS — L03116 Cellulitis of left lower limb: Secondary | ICD-10-CM

## 2012-09-14 NOTE — Progress Notes (Signed)
  Subjective:    Patient ID: Wayne [name], male    DOB: 06/03/1960, 52 y.o.   MRN: [id number]  HPI Here for a recheck on infection on left lower leg. He is taking antibiotic but still having redness and swelling. Patient relates that there is some swelling redness tenderness he states no high fever chills or sweats. He was in the hospital for this illness. He he had clindamycin at home he went ahead and restarted. He is here today to have it checked he states he has significant swelling in this area ever since having the infection. Denies any pain or discomfort with it. PMH see previous hospital note. [facility] benign  Review of Systems Negative for fevers drainage soreness or pain    Objective:   Physical Exam  Calf is nontender ankle is normal the area of the discomfort in the medial aspect distal tibia region swollen but not tender. No drainage noted. I believe that this area should gradually get better.      Assessment & Plan:  Time was spent with this patient discussing lymphedema that's related to previous infection. I don't recommend any other particular type of treatment at this time  I do believe that this patient would benefit from compression stocking. He should go ahead and finish the antibiotics he started a couple days ago. If persistent problems let us know or if pain or discomfort or drainage let us know

## 2012-09-28 LAB — HEPATIC FUNCTION PANEL
ALT: 45 U/L (ref 0–53)
Alkaline Phosphatase: 61 U/L (ref 39–117)
Indirect Bilirubin: 0.4 mg/dL (ref 0.0–0.9)
Total Protein: 6.8 g/dL (ref 6.0–8.3)

## 2012-09-28 LAB — BASIC METABOLIC PANEL
CO2: 29 mEq/L (ref 19–32)
Chloride: 105 mEq/L (ref 96–112)
Creat: 1.27 mg/dL (ref 0.50–1.35)
Glucose, Bld: 90 mg/dL (ref 70–99)
Sodium: 141 mEq/L (ref 135–145)

## 2012-09-28 LAB — LIPID PANEL: LDL Cholesterol: 131 mg/dL — ABNORMAL HIGH (ref 0–99)

## 2012-09-28 LAB — PSA: PSA: 0.41 ng/mL (ref <=4.00)

## 2012-10-08 ENCOUNTER — Ambulatory Visit (INDEPENDENT_AMBULATORY_CARE_PROVIDER_SITE_OTHER): Payer: BC Managed Care – PPO | Admitting: Family Medicine

## 2012-10-08 ENCOUNTER — Encounter: Payer: Self-pay | Admitting: Family Medicine

## 2012-10-08 VITALS — BP 118/80 | Ht 66.0 in | Wt 186.8 lb

## 2012-10-08 DIAGNOSIS — Z0000 Encounter for general adult medical examination without abnormal findings: Secondary | ICD-10-CM

## 2012-10-08 MED ORDER — BUPROPION HCL ER (SR) 150 MG PO TB12: 150.0000 mg | ORAL_TABLET | Freq: Two times a day (BID) | ORAL | Status: DC

## 2012-10-08 MED ORDER — PRAVASTATIN SODIUM 20 MG PO TABS: 20.0000 mg | ORAL_TABLET | Freq: Every day | ORAL | Status: DC

## 2012-10-08 MED ORDER — KETOCONAZOLE 2 % EX CREA: TOPICAL_CREAM | Freq: Two times a day (BID) | CUTANEOUS | Status: DC

## 2012-10-08 NOTE — Progress Notes (Signed)
  Subjective:    Patient ID: Wayne [name], male    DOB: 1960-03-31, 52 y.o.   MRN: [id number]  HPI Here today for annual exam.   Wants to discuss creatine levels.  Also said that Dr. Brett Canales talked about getting a medication for his feet. He believes it is a fungus.  The patient comes in today for a wellness visit.  A review of their health history was completed.  A review of medications was also completed. Any necessary refills were discussed. Sensible healthy diet was discussed. Importance of minimizing excessive salt and carbohydrates was also discussed. Safety was stressed including driving, activities at work and at home where applicable. Importance of regular physical activity for overall health was discussed. Preventative measures appropriate for age were discussed. Time was spent with the patient discussing any concerns they have about their well-being.     Review of Systems  Constitutional: Negative for fever, activity change and appetite change.  HENT: Negative for congestion, rhinorrhea and neck pain.   Eyes: Negative for discharge.  Respiratory: Negative for cough and wheezing.   Cardiovascular: Negative for chest pain.  Gastrointestinal: Negative for vomiting, abdominal pain and blood in stool.  Genitourinary: Negative for frequency and difficulty urinating.  Skin: Negative for rash.  Allergic/Immunologic: Negative for environmental allergies and food allergies.  Neurological: Negative for weakness and headaches.  Psychiatric/Behavioral: Negative for agitation.       Objective:   Physical Exam  Constitutional: He appears well-developed and well-nourished.  HENT:  Head: Normocephalic and atraumatic.  Right Ear: External ear normal.  Left Ear: External ear normal.  Nose: Nose normal.  Mouth/Throat: Oropharynx is clear and moist.  Eyes: EOM are normal. Pupils are equal, round, and reactive to light.  Neck: Normal range of motion. Neck supple. No thyromegaly  present.  Cardiovascular: Normal rate, regular rhythm and normal heart sounds.   No murmur heard. Pulmonary/Chest: Effort normal and breath sounds normal. No respiratory distress. He has no wheezes.  Abdominal: Soft. Bowel sounds are normal. He exhibits no distension and no mass. There is no tenderness.  Genitourinary: Prostate normal and penis normal.  Musculoskeletal: Normal range of motion. He exhibits no edema.  Lymphadenopathy:    He has no cervical adenopathy.  Neurological: He is alert. He exhibits normal muscle tone.  Skin: Skin is warm and dry. No erythema.  Psychiatric: He has a normal mood and affect. His behavior is normal. Judgment normal.          Assessment & Plan:  Wellness exam-patient overall doing very well the best thing he needs to do for himself though his work hard on diet he's let this slipped recently he has quit smoking it's almost been a year. I encouraged him to continue to stay away from this smoking, eat healthier, exercise lose some weight he will followup in 6 months I would recommend that he should consider rechecking cholesterol profile and metabolic 7 in approximately within 3 months. Also recommend patient maintain current medications. Safety measures dietary measures all discussed

## 2012-12-03 ENCOUNTER — Telehealth: Payer: Self-pay | Admitting: Family Medicine

## 2012-12-03 NOTE — Telephone Encounter (Signed)
I am not against people seeing her specialists. But if there specialist is going to say they need a referral every time that if I have not seen them recently I am going to request that I see them so that I can lid legitimately referred them rather than just being a paper/electronic go between

## 2012-12-03 NOTE — Telephone Encounter (Signed)
Pt needs referral to Dr. Karilyn Cota to have esophagus stretched, please initiate in system so that I may process

## 2012-12-04 NOTE — Telephone Encounter (Signed)
Per Ann - If we haven’t seen the patient within last 3 years, we have them see their PCP as some issues may not be GI related.  ° °Wayne [name] was seen in 2012 so I’m not sure why he was told to get a referral. I don’t have any info for Wayne [name] so she would have been told to see her PCP to be evaluated and if her issues are GI related then her PCP can refer her.  ° °Hope this helps ° °

## 2012-12-04 NOTE — Telephone Encounter (Signed)
Appt scheduled 12/10/12 @ 2:15 w/Teri Valetta Fuller, [facility] to notify pt, also mailed letter

## 2012-12-04 NOTE — Telephone Encounter (Signed)
Emailed Ann @ Dr. Patty Sermons for clarification, await response

## 2012-12-10 ENCOUNTER — Ambulatory Visit (INDEPENDENT_AMBULATORY_CARE_PROVIDER_SITE_OTHER): Payer: BC Managed Care – PPO | Admitting: Internal Medicine

## 2012-12-17 ENCOUNTER — Encounter (INDEPENDENT_AMBULATORY_CARE_PROVIDER_SITE_OTHER): Payer: Self-pay | Admitting: *Deleted

## 2012-12-17 ENCOUNTER — Other Ambulatory Visit (INDEPENDENT_AMBULATORY_CARE_PROVIDER_SITE_OTHER): Payer: Self-pay | Admitting: *Deleted

## 2012-12-17 ENCOUNTER — Encounter (INDEPENDENT_AMBULATORY_CARE_PROVIDER_SITE_OTHER): Payer: Self-pay | Admitting: Internal Medicine

## 2012-12-17 ENCOUNTER — Ambulatory Visit (INDEPENDENT_AMBULATORY_CARE_PROVIDER_SITE_OTHER): Payer: BC Managed Care – PPO | Admitting: Internal Medicine

## 2012-12-17 VITALS — BP 96/58 | HR 72 | Temp 99.1°F | Ht 66.5 in | Wt 191.6 lb

## 2012-12-17 DIAGNOSIS — R1310 Dysphagia, unspecified: Secondary | ICD-10-CM

## 2012-12-17 MED ORDER — OMEPRAZOLE 40 MG PO CPDR: 40.0000 mg | DELAYED_RELEASE_CAPSULE | Freq: Every day | ORAL | Status: DC
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
## 2012-12-17 NOTE — Patient Instructions (Signed)
EGD/ED with Dr. Rehman. The risks and benefits such as perforation, bleeding, and infection were reviewed with the patient and is agreeable. 

## 2012-12-29 ENCOUNTER — Encounter (HOSPITAL_COMMUNITY): Payer: Self-pay

## 2012-12-30 ENCOUNTER — Encounter (HOSPITAL_COMMUNITY)
Admission: RE | Disposition: A | Discharge status: Home Health | Payer: Self-pay | Source: Ambulatory Visit | Attending: Internal Medicine

## 2012-12-30 ENCOUNTER — Encounter (HOSPITAL_COMMUNITY): Payer: Self-pay | Admitting: *Deleted

## 2012-12-30 ENCOUNTER — Ambulatory Visit (HOSPITAL_COMMUNITY)
Admission: RE | Admit: 2012-12-30 | Discharge: 2012-12-30 | Disposition: A | Discharge status: Home Health | Payer: BC Managed Care – PPO | Source: Ambulatory Visit | Attending: Internal Medicine | Admitting: Internal Medicine

## 2012-12-30 DIAGNOSIS — R1310 Dysphagia, unspecified: Secondary | ICD-10-CM

## 2012-12-30 DIAGNOSIS — K222 Esophageal obstruction: Secondary | ICD-10-CM

## 2012-12-30 DIAGNOSIS — K449 Diaphragmatic hernia without obstruction or gangrene: Secondary | ICD-10-CM

## 2012-12-30 HISTORY — PX: ESOPHAGOGASTRODUODENOSCOPY (EGD) WITH ESOPHAGEAL DILATION: SHX5812

## 2012-12-30 SURGERY — ESOPHAGOGASTRODUODENOSCOPY (EGD) WITH ESOPHAGEAL DILATION
Anesthesia: Moderate Sedation

## 2012-12-30 MED ORDER — ATROPINE SULFATE 0.1 MG/ML IJ SOLN
INTRAMUSCULAR | Status: DC | PRN
  Administered 2012-12-30: 0.5 mg via INTRAVENOUS

## 2012-12-30 MED ORDER — MEPERIDINE HCL 25 MG/ML IJ SOLN
INTRAMUSCULAR | Status: DC | PRN
  Administered 2012-12-30 (×2): 25 mg via INTRAVENOUS

## 2012-12-30 MED ORDER — MIDAZOLAM HCL 5 MG/5ML IJ SOLN
INTRAMUSCULAR | Status: DC | PRN
  Administered 2012-12-30: 1 mg via INTRAVENOUS
  Administered 2012-12-30: 3 mg via INTRAVENOUS
  Administered 2012-12-30: 2 mg via INTRAVENOUS
  Administered 2012-12-30: 1 mg via INTRAVENOUS
  Administered 2012-12-30: 2 mg via INTRAVENOUS

## 2012-12-30 MED ORDER — STERILE WATER FOR IRRIGATION IR SOLN
Status: DC | PRN
  Administered 2012-12-30: 14:00:00

## 2012-12-30 MED ORDER — MEPERIDINE HCL 50 MG/ML IJ SOLN
INTRAMUSCULAR | Status: AC
  Filled 2012-12-30: qty 1

## 2012-12-30 MED ORDER — BUTAMBEN-TETRACAINE-BENZOCAINE 2-2-14 % EX AERO
INHALATION_SPRAY | CUTANEOUS | Status: DC | PRN
  Administered 2012-12-30: 2 via TOPICAL

## 2012-12-30 MED ORDER — SODIUM CHLORIDE 0.9 % IV SOLN
INTRAVENOUS | Status: DC
Start: 2012-12-30 — End: 2012-12-30
  Administered 2012-12-30: 13:00:00 via INTRAVENOUS

## 2012-12-30 MED ORDER — MIDAZOLAM HCL 5 MG/5ML IJ SOLN
INTRAMUSCULAR | Status: AC
  Filled 2012-12-30: qty 10
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
## 2012-12-30 NOTE — Op Note (Signed)
EGD PROCEDURE REPORT  PATIENT:  Wayne [name]  MR#:  [id number] Birthdate:  02/12/1961, 52 y.o., male Endoscopist:  Dr. Malissa Hippo, MD Referred By:  Dr. Lilyan Punt, MD Procedure Date: 12/30/2012  Procedure:   EGD with ED.  Indications:   Patient is 52 year old Caucasian male who has a history of Schatzki's ring which was last dilated in May 2012 who presents with intermittent solid food dysphagia. His heartburn is well controlled with PPI.             Informed Consent:  The risks, benefits, alternatives & imponderables which include, but are not limited to, bleeding, infection, perforation, drug reaction and potential missed lesion have been reviewed.  The potential for biopsy, lesion removal, esophageal dilation, etc. have also been discussed.  Questions have been answered.  All parties agreeable.  Please see history & physical in medical record for more information.  Medications:  Demerol 50 mg IV Versed 9 mg IV Atropine oh 0.5 mg IV for asymptomatic bradycardia during esophageal dilation. Cetacaine spray topically for oropharyngeal anesthesia  Description of procedure:  The endoscope was introduced through the mouth and advanced to the second portion of the duodenum without difficulty or limitations. The mucosal surfaces were surveyed very carefully during advancement of the scope and upon withdrawal.  Findings:  Esophagus:  mucosa of the esophagus was normal. Ring noted at GE junction.  GEJ:  38 cm Hiatus:  41 cm Stomach:   stomach was empty and distended very well with insufflation. Folds in the proximal stomach were normal. Examination of mucosa at body, antrum, pyloric Cheung, and numerous, fundus and cardia was normal. Duodenum:   normal bulbar and post bulbar mucosa   Therapeutic/Diagnostic Maneuvers Performed:   Esophagus dilated by passing 58 French Maloney dilator to full insertion. Endoscope was passed post dilation and ring noted have been disrupted. It was further  disrupted on the left side with focal biopsy but no tissue was saved.  Complications:   none   Impression: Schatzki's ring and small sliding hiatal hernia. Esophagus dilated by passing 58 French Maloney dilator and ring was disrupted; further disruption undertaken with focal biopsy.  Recommendations:   Sandard instructions given. Patient will call the office with progress report in one week.  Hilari Wethington U  12/30/2012  2:27 PM  CC: Dr. Lilyan Punt, MD & Dr. Bonnetta Barry ref. provider found

## 2013-01-05 ENCOUNTER — Encounter (HOSPITAL_COMMUNITY): Payer: Self-pay | Admitting: Internal Medicine

## 2013-01-28 ENCOUNTER — Encounter: Payer: Self-pay | Admitting: Family Medicine

## 2013-01-28 ENCOUNTER — Ambulatory Visit (INDEPENDENT_AMBULATORY_CARE_PROVIDER_SITE_OTHER): Payer: BC Managed Care – PPO | Admitting: Family Medicine

## 2013-01-28 VITALS — BP 132/98 | Ht 66.0 in | Wt 193.0 lb

## 2013-01-28 DIAGNOSIS — M25511 Pain in right shoulder: Secondary | ICD-10-CM

## 2013-01-28 DIAGNOSIS — K219 Gastro-esophageal reflux disease without esophagitis: Secondary | ICD-10-CM | POA: Insufficient documentation

## 2013-01-28 DIAGNOSIS — M25519 Pain in unspecified shoulder: Secondary | ICD-10-CM

## 2013-01-28 MED ORDER — PANTOPRAZOLE SODIUM 40 MG PO TBEC: 40.0000 mg | DELAYED_RELEASE_TABLET | Freq: Every day | ORAL | Status: DC

## 2013-01-28 MED ORDER — MELOXICAM 15 MG PO TABS: 15.0000 mg | ORAL_TABLET | Freq: Every day | ORAL | Status: DC

## 2013-01-28 NOTE — Progress Notes (Signed)
   Subjective:    Patient ID: Wayne [name], male    DOB: 06-Dec-1960, 52 y.o.   MRN: [id number]  Shoulder Pain  The pain is present in the right shoulder. This is a new problem. The current episode started 1 to 4 weeks ago. The problem occurs constantly. The quality of the pain is described as dull. The symptoms are aggravated by activity. He has tried NSAIDS for the symptoms. The treatment provided no relief.   No known injury.  Mild reflux symptoms current and tired reflux medicine causing heartburn/belching he would like to try a different one   Review of Systems Does relate some reflux symptoms no chest pain does relate right shoulder pain no radicular pain    Objective:   Physical Exam Right shoulder mild pain and discomfort posterior aspect slight limitation range of motion lungs are clear hearts regular neck no masses  Injection of right shoulder with half cc of Depo-Medrol, 20 mg, along with 1 cc of lidocaine without epinephrine. Anterior approach into the joint without complication sterile technique       Assessment & Plan:  Right shoulder bursitis with shoulder injection shown range of motion exercises anti-inflammatory next few weeks if progressive troubles let us know we will refer to orthopedics.  History reflux patient requests different medication other medicine did not agree with him, the medicine was given if further troubles notify us

## 2013-02-19 ENCOUNTER — Telehealth: Payer: Self-pay | Admitting: Family Medicine

## 2013-02-19 MED ORDER — PANTOPRAZOLE SODIUM 40 MG PO TBEC: 40.0000 mg | DELAYED_RELEASE_TABLET | Freq: Every day | ORAL | Status: DC

## 2013-02-19 NOTE — Telephone Encounter (Signed)
Medication sent to pharmacy. Patient was notified.  

## 2013-02-19 NOTE — Telephone Encounter (Signed)
Patient needs Rx for Protonix 3 month supply to [facility] due to a change in insurance. Their [phone]

## 2013-10-05 ENCOUNTER — Other Ambulatory Visit: Payer: Self-pay | Admitting: *Deleted

## 2013-10-05 MED ORDER — PANTOPRAZOLE SODIUM 40 MG PO TBEC: 40.0000 mg | DELAYED_RELEASE_TABLET | Freq: Every day | ORAL | Status: DC

## 2013-10-08 ENCOUNTER — Other Ambulatory Visit: Payer: Self-pay | Admitting: *Deleted

## 2013-10-08 MED ORDER — PANTOPRAZOLE SODIUM 40 MG PO TBEC: 40.0000 mg | DELAYED_RELEASE_TABLET | Freq: Every day | ORAL | Status: DC

## 2013-10-26 ENCOUNTER — Telehealth: Payer: Self-pay | Admitting: Family Medicine

## 2013-10-26 ENCOUNTER — Other Ambulatory Visit: Payer: Self-pay | Admitting: Family Medicine

## 2013-10-26 DIAGNOSIS — Z79899 Other long term (current) drug therapy: Secondary | ICD-10-CM

## 2013-10-26 DIAGNOSIS — Z125 Encounter for screening for malignant neoplasm of prostate: Secondary | ICD-10-CM

## 2013-10-26 DIAGNOSIS — E782 Mixed hyperlipidemia: Secondary | ICD-10-CM

## 2013-10-26 NOTE — Telephone Encounter (Signed)
Lipid, Liver, Met 7 and PSA in 8/14

## 2013-10-26 NOTE — Telephone Encounter (Signed)
Blood work ordered in Epic. Patient notified. 

## 2013-10-26 NOTE — Telephone Encounter (Signed)
Repeat same labs as last year add CBC

## 2013-10-26 NOTE — Telephone Encounter (Signed)
Patient needs order for blood work. °

## 2013-10-26 NOTE — Telephone Encounter (Signed)
Last seen 01/28/13 (sick)

## 2013-11-10 LAB — HEPATIC FUNCTION PANEL
ALT: 18 U/L (ref 0–53)
AST: 17 U/L (ref 0–37)
Albumin: 4.2 g/dL (ref 3.5–5.2)
Alkaline Phosphatase: 78 U/L (ref 39–117)
Bilirubin, Direct: 0.1 mg/dL (ref 0.0–0.3)
Indirect Bilirubin: 0.4 mg/dL (ref 0.2–1.2)
Total Bilirubin: 0.5 mg/dL (ref 0.2–1.2)
Total Protein: 6.9 g/dL (ref 6.0–8.3)

## 2013-11-10 LAB — PSA: PSA: 0.32 ng/mL (ref <=4.00)

## 2013-11-10 LAB — LIPID PANEL
Cholesterol: 151 mg/dL (ref 0–200)
HDL: 48 mg/dL (ref >39)
LDL CALC: 84 mg/dL (ref 0–99)
Total CHOL/HDL Ratio: 3.1 Ratio
Triglycerides: 95 mg/dL (ref <150)
VLDL: 19 mg/dL (ref 0–40)

## 2013-11-10 LAB — CBC WITH DIFFERENTIAL/PLATELET
BASOS ABS: 0 10*3/uL (ref 0.0–0.1)
Basophils Relative: 0 % (ref 0–1)
EOS PCT: 2 % (ref 0–5)
Eosinophils Absolute: 0.1 10*3/uL (ref 0.0–0.7)
HCT: 47.7 % (ref 39.0–52.0)
Hemoglobin: 16.1 g/dL (ref 13.0–17.0)
Lymphocytes Relative: 19 % (ref 12–46)
Lymphs Abs: 1 10*3/uL (ref 0.7–4.0)
MCH: 29.7 pg (ref 26.0–34.0)
MCHC: 33.8 g/dL (ref 30.0–36.0)
MCV: 88 fL (ref 78.0–100.0)
MONO ABS: 0.5 10*3/uL (ref 0.1–1.0)
Monocytes Relative: 10 % (ref 3–12)
Neutro Abs: 3.6 10*3/uL (ref 1.7–7.7)
Neutrophils Relative %: 69 % (ref 43–77)
Platelets: 212 10*3/uL (ref 150–400)
RBC: 5.42 MIL/uL (ref 4.22–5.81)
RDW: 13.8 % (ref 11.5–15.5)
WBC: 5.2 10*3/uL (ref 4.0–10.5)

## 2013-11-10 LAB — BASIC METABOLIC PANEL
BUN: 15 mg/dL (ref 6–23)
CO2: 26 mEq/L (ref 19–32)
Calcium: 9.1 mg/dL (ref 8.4–10.5)
Chloride: 105 mEq/L (ref 96–112)
Creat: 1.31 mg/dL (ref 0.50–1.35)
Glucose, Bld: 94 mg/dL (ref 70–99)
POTASSIUM: 4.5 meq/L (ref 3.5–5.3)
Sodium: 141 mEq/L (ref 135–145)

## 2013-11-18 ENCOUNTER — Encounter: Payer: Self-pay | Admitting: Family Medicine

## 2013-11-18 ENCOUNTER — Ambulatory Visit (INDEPENDENT_AMBULATORY_CARE_PROVIDER_SITE_OTHER): Payer: BC Managed Care – PPO | Admitting: Family Medicine

## 2013-11-18 VITALS — BP 110/70 | Ht 66.0 in | Wt 183.0 lb

## 2013-11-18 DIAGNOSIS — Z0000 Encounter for general adult medical examination without abnormal findings: Secondary | ICD-10-CM

## 2013-11-18 DIAGNOSIS — L301 Dyshidrosis [pompholyx]: Secondary | ICD-10-CM

## 2013-11-18 DIAGNOSIS — K219 Gastro-esophageal reflux disease without esophagitis: Secondary | ICD-10-CM

## 2013-11-18 DIAGNOSIS — L309 Dermatitis, unspecified: Secondary | ICD-10-CM

## 2013-11-18 DIAGNOSIS — E785 Hyperlipidemia, unspecified: Secondary | ICD-10-CM

## 2013-11-18 DIAGNOSIS — Z23 Encounter for immunization: Secondary | ICD-10-CM

## 2013-11-18 MED ORDER — PRAVASTATIN SODIUM 20 MG PO TABS: 20.0000 mg | ORAL_TABLET | Freq: Every day | ORAL | Status: DC

## 2013-11-18 MED ORDER — CLOBETASOL PROPIONATE 0.05 % EX CREA: TOPICAL_CREAM | CUTANEOUS | Status: DC

## 2013-11-18 MED ORDER — CITALOPRAM HYDROBROMIDE 10 MG PO TABS: 10.0000 mg | ORAL_TABLET | Freq: Every day | ORAL | Status: DC

## 2013-11-18 MED ORDER — KETOCONAZOLE 2 % EX CREA: 1.0000 "application " | TOPICAL_CREAM | Freq: Two times a day (BID) | CUTANEOUS | Status: DC | PRN

## 2013-11-18 MED ORDER — PANTOPRAZOLE SODIUM 40 MG PO TBEC: 40.0000 mg | DELAYED_RELEASE_TABLET | Freq: Every day | ORAL | Status: DC

## 2013-11-18 NOTE — Progress Notes (Signed)
   Subjective:    Patient ID: Wayne [name], male    DOB: 07-24-1960, 53 y.o.   MRN: [id number]  HPI Patient is here today for his wellness exam. Patient would like to discuss the creams he his currently taking. The patient comes in today for a wellness visit.    A review of their health history was completed.  A review of medications was also completed.  Any needed refills; creams  Eating habits: good  Falls/  MVA accidents in past few months: none  Regular exercise: mild  Specialist pt sees on regular basis: none  Preventative health issues were discussed.   Additional concerns: feet rashes Lab work was reviewed with the patient including his cholesterol kidney functions PSA. Citalopram doing well for him Relates reflux under good control with protonic 6 refills given Hyperlipidemia under good control with medication refills given   Review of Systems    he denies headaches denies chest tightness pressure pain shortness breath denies rectal bleeding Objective:   Physical Exam Eardrums normal throat normal neck supple lungs clear heart regular abdomen soft prostate exam not remedies no edema evidence of dyshidrotic eczema noted       Assessment & Plan:  Physical exam-in addition to the physical exam we did cover multiple other health problems including dyshidrotic eczema on the foot. See details below. Safety measures dietary measures all discussed.  1. Routine general medical examination at a health care facility See discussion above  2. Hyperlipemia Patient is continue medication lab work looks good  3. Gastroesophageal reflux disease without esophagitis Reflux under good control continue current measures  4. Vesicular foot eczema Use steroid cream as prescribed.  5. Encounter for immunization Given today  Also patient was given prescription for tinea infection And was changed from Wellbutrin to Celexa to get better coverage to help him with his moods.  Patient is trying mind Fullness to help as well

## 2013-11-23 ENCOUNTER — Encounter: Payer: Self-pay | Admitting: Family Medicine

## 2013-11-23 ENCOUNTER — Ambulatory Visit (INDEPENDENT_AMBULATORY_CARE_PROVIDER_SITE_OTHER): Payer: BC Managed Care – PPO | Admitting: Family Medicine

## 2013-11-23 VITALS — BP 120/78 | Temp 99.4°F | Ht 66.0 in | Wt 185.0 lb

## 2013-11-23 DIAGNOSIS — J329 Chronic sinusitis, unspecified: Secondary | ICD-10-CM

## 2013-11-23 DIAGNOSIS — J310 Chronic rhinitis: Secondary | ICD-10-CM

## 2013-11-23 MED ORDER — CEFDINIR 300 MG PO CAPS: 300.0000 mg | ORAL_CAPSULE | Freq: Two times a day (BID) | ORAL | Status: DC

## 2013-11-23 NOTE — Progress Notes (Signed)
   Subjective:    Patient ID: Wayne [name], male    DOB: [birth year], 53 y.o.   MRN: [id number]  Cough This is a new problem. The current episode started in the past 7 days. Associated symptoms include headaches, myalgias, nasal congestion and a sore throat.   Sore throat started   Throat har d to swallow  Some cough   Cough and cong estion     Energy level not good  Decent apppetitie  otc meds prn     Review of Systems  HENT: Positive for sore throat.   Respiratory: Positive for cough.   Musculoskeletal: Positive for myalgias.  Neurological: Positive for headaches.       Objective:   Physical Exam Alert moderate malaise. Frontal maxillary tenderness. Pharynx erythematous neck supple lungs clear. Heart regular in rhythm.       Assessment & Plan:  Impression acute rhinosinusitis plan antibiotics prescribed. Symptomatic care discussed. Warning signs discussed. WSL

## 2014-03-10 ENCOUNTER — Ambulatory Visit (INDEPENDENT_AMBULATORY_CARE_PROVIDER_SITE_OTHER): Payer: BLUE CROSS/BLUE SHIELD | Admitting: Family Medicine

## 2014-03-10 ENCOUNTER — Encounter: Payer: Self-pay | Admitting: Family Medicine

## 2014-03-10 VITALS — BP 114/78 | Temp 98.6°F | Ht 66.0 in | Wt 193.0 lb

## 2014-03-10 DIAGNOSIS — B353 Tinea pedis: Secondary | ICD-10-CM

## 2014-03-10 DIAGNOSIS — E785 Hyperlipidemia, unspecified: Secondary | ICD-10-CM

## 2014-03-10 DIAGNOSIS — K219 Gastro-esophageal reflux disease without esophagitis: Secondary | ICD-10-CM

## 2014-03-10 MED ORDER — CITALOPRAM HYDROBROMIDE 10 MG PO TABS: 10.0000 mg | ORAL_TABLET | Freq: Every day | ORAL | Status: DC

## 2014-03-10 MED ORDER — PRAVASTATIN SODIUM 20 MG PO TABS
20.0000 mg | ORAL_TABLET | Freq: Every day | ORAL | Status: DC
Start: 2014-03-10 — End: 2015-06-07

## 2014-03-10 MED ORDER — TERBINAFINE HCL 250 MG PO TABS: 250.0000 mg | ORAL_TABLET | Freq: Every day | ORAL | Status: DC

## 2014-03-10 MED ORDER — PANTOPRAZOLE SODIUM 40 MG PO TBEC: 40.0000 mg | DELAYED_RELEASE_TABLET | Freq: Every day | ORAL | Status: DC

## 2014-03-10 NOTE — Progress Notes (Signed)
   Subjective:    Patient ID: Wayne [name], male    DOB: 1960/07/24, 54 y.o.   MRN: [id number]  HPIRash on right foot. Itching worse at night. Foot swelling and has heat in it.  Using ketoconazole cream BID.   Requesting 90 day refills to [facility] for celexa, protonix, and pravastatin.  Patient trying eat healthy staying physically active. States overall he feels good. Review of Systems Itching burning discomfort.    Objective:   Physical Exam Lungs clear heart regular pulse normal extremities no edema severe tinea pedis noted on her right foot as well as maceration between the toes. Also maceration between the toes on the left foot.       Assessment & Plan:  Severe tinea pedis. Failed outpatient management with keeping, is all. I recommend Lamisil 250 mg 1 daily for the next 30 days. If needing a refill we will need to check liver profile  Hyperlipidemia continue medication refills and check lab work later this year follow-up in approximate 6 months  Patient needing refills on other medicines these were given

## 2014-03-29 ENCOUNTER — Telehealth: Payer: Self-pay | Admitting: Family Medicine

## 2014-03-29 NOTE — Telephone Encounter (Signed)
Pt seen on 01/28.  Assessment: Severe tinea pedis. Failed outpatient management with keeping, is all. I recommend Lamisil 250 mg 1 daily for the next 30 days. If needing a refill we will need to check liver profile

## 2014-03-29 NOTE — Telephone Encounter (Signed)
LMRC

## 2014-03-29 NOTE — Telephone Encounter (Signed)
Patient states rash on foot got better but now its worst and wanting something else called into [facility].

## 2014-03-29 NOTE — Telephone Encounter (Signed)
NTC review with pt did Lamisil help? What sx currently? ( discuss with him his concern)

## 2014-04-14 NOTE — Telephone Encounter (Signed)
Left message to return call 

## 2014-11-16 ENCOUNTER — Ambulatory Visit (INDEPENDENT_AMBULATORY_CARE_PROVIDER_SITE_OTHER): Payer: BLUE CROSS/BLUE SHIELD | Admitting: Family Medicine

## 2014-11-16 ENCOUNTER — Encounter: Payer: Self-pay | Admitting: Family Medicine

## 2014-11-16 VITALS — BP 112/78 | Temp 98.3°F | Ht 66.0 in | Wt 199.0 lb

## 2014-11-16 DIAGNOSIS — L0291 Cutaneous abscess, unspecified: Secondary | ICD-10-CM

## 2014-11-16 DIAGNOSIS — L0390 Cellulitis, unspecified: Secondary | ICD-10-CM

## 2014-11-16 MED ORDER — DOXYCYCLINE HYCLATE 100 MG PO TABS: 100.0000 mg | ORAL_TABLET | Freq: Two times a day (BID) | ORAL | Status: DC

## 2014-11-16 NOTE — Progress Notes (Signed)
   Subjective:    Patient ID: Wayne [name], male    DOB: Aug 22, 1960, 54 y.o.   MRN: [id number]  Penis Injury The patient's primary symptoms include scrotal swelling and testicular pain. This is a new problem. The current episode started in the past 7 days. The problem occurs constantly. The problem has been unchanged. The pain is medium. There is no reported injury. The testicular pain affects the left testicle. There is swelling in the left testicle. Nothing aggravates the symptoms. He has tried nothing for the symptoms. The treatment provided no relief.    Noticed sore spot sat then got bigger  Swelling kick ed in Monday  Pain more painful on one side   Patient states that he has no other concerns at this time.   Positive history of MRSA Review of Systems  Genitourinary: Positive for scrotal swelling and testicular pain.       Objective:   Physical Exam  Alert vitals stable lungs clear heart rare rhythm scrotum swollen erythematous red positive fluctuance  Procedure note patient was prepped and anesthetized and incised drained    Assessment & Plan:  Impression I and D abscess cellulitis plan [place] twice a day 10 days history of MRSA WSL warning signs discussed

## 2015-06-07 ENCOUNTER — Other Ambulatory Visit: Payer: Self-pay

## 2015-06-07 NOTE — Telephone Encounter (Signed)
Fax from pharmacy requesting 90 day supply on these meds. Patient has not had a check up since January 2016.

## 2015-06-07 NOTE — Telephone Encounter (Signed)
This goes to scott, plz note no lip management visit for 15 months

## 2015-06-08 MED ORDER — CITALOPRAM HYDROBROMIDE 10 MG PO TABS: 10.0000 mg | ORAL_TABLET | Freq: Every day | ORAL | Status: DC

## 2015-06-08 MED ORDER — PRAVASTATIN SODIUM 20 MG PO TABS: 20.0000 mg | ORAL_TABLET | Freq: Every day | ORAL | Status: DC

## 2015-06-08 MED ORDER — PANTOPRAZOLE SODIUM 40 MG PO TBEC: 40.0000 mg | DELAYED_RELEASE_TABLET | Freq: Every day | ORAL | Status: DC

## 2015-06-08 NOTE — Telephone Encounter (Signed)
30 day prescription no refills needs office visit

## 2015-07-07 ENCOUNTER — Encounter: Payer: Self-pay | Admitting: Family Medicine

## 2015-07-07 ENCOUNTER — Ambulatory Visit (INDEPENDENT_AMBULATORY_CARE_PROVIDER_SITE_OTHER): Admitting: Family Medicine

## 2015-07-07 VITALS — BP 122/76 | Ht 66.0 in | Wt 198.2 lb

## 2015-07-07 DIAGNOSIS — R413 Other amnesia: Secondary | ICD-10-CM

## 2015-07-07 DIAGNOSIS — F419 Anxiety disorder, unspecified: Secondary | ICD-10-CM | POA: Diagnosis not present

## 2015-07-07 DIAGNOSIS — R0600 Dyspnea, unspecified: Secondary | ICD-10-CM | POA: Diagnosis not present

## 2015-07-07 DIAGNOSIS — Z139 Encounter for screening, unspecified: Secondary | ICD-10-CM | POA: Diagnosis not present

## 2015-07-07 DIAGNOSIS — E785 Hyperlipidemia, unspecified: Secondary | ICD-10-CM

## 2015-07-07 DIAGNOSIS — R5383 Other fatigue: Secondary | ICD-10-CM

## 2015-07-07 MED ORDER — PRAVASTATIN SODIUM 20 MG PO TABS: 20.0000 mg | ORAL_TABLET | Freq: Every day | ORAL | Status: DC

## 2015-07-07 MED ORDER — PANTOPRAZOLE SODIUM 40 MG PO TBEC: 40.0000 mg | DELAYED_RELEASE_TABLET | Freq: Every day | ORAL | Status: DC

## 2015-07-07 MED ORDER — SERTRALINE HCL 50 MG PO TABS
50.0000 mg | ORAL_TABLET | Freq: Every day | ORAL | Status: DC
Start: 2015-07-07 — End: 2015-07-24

## 2015-07-07 NOTE — Progress Notes (Signed)
Subjective:    Patient ID: Wayne [name], male    DOB: 1960-04-29, 55 y.o.   MRN: [id number]  Anxiety Presents for follow-up visit. Patient reports no chest pain or confusion.   Treatments tried: Celexa.   Patient in today to discuss left sided abdominal pain, and raised area to right side. Has c/o SOB. Exceptionally nice gentleman comes in today for evaluation He is been through some real difficult aspects Recently lost his job after 19 years with [employer] Patients had some problems with not feeling good about himself feeling depressed at times feeling anxious at times feeling stressed denies being suicidal He does relate he gets short of breath with mild activity. Has a history of smoking but does not smoke currently Patient relates that he is try to do a good job stained physically active and eating healthy He does relate moderate fatigue and tiredness as well Denies rectal bleeding hematuria  He is concerned about short-term memory loss he states he forgets things easily and has a hard time following through on things he wonders if there may be some underlying issues going on He states he does have a family history of premature Alzheimer's. Review of Systems  Constitutional: Negative for activity change, appetite change and fatigue.  HENT: Negative for congestion.   Respiratory: Negative for cough.   Cardiovascular: Negative for chest pain.  Gastrointestinal: Negative for abdominal pain.  Endocrine: Negative for polydipsia and polyphagia.  Neurological: Negative for weakness.  Psychiatric/Behavioral: Negative for confusion.       Objective:   Physical Exam  Constitutional: He appears well-nourished. No distress.  Cardiovascular: Normal rate, regular rhythm and normal heart sounds.   No murmur heard. Pulmonary/Chest: Effort normal and breath sounds normal. No respiratory distress.  Musculoskeletal: He exhibits no edema.  Lymphadenopathy:    He has no cervical adenopathy.   Neurological: He is alert.  Psychiatric: His behavior is normal.  Vitals reviewed.         Assessment & Plan:  1. Poor short-term memory There is increased family history of early-onset dementia and problems with cognitive dysfunction memory issues as well as Alzheimer's. Patient is concerned about this. Please see mental health discussion above. It is possible some distress sees been under is been causingstates he's been having some memory and processing issues for months and it's becoming more noticeable. I believe the patient would benefit from seeing a neurology specialist for further evaluation regarding cognitive decline - CBC with Differential/Platelet - Ambulatory referral to Neurology  2. Hyperlipidemia Continue cholesterol medicine check lipid liver profile previous medication and lab work reviewed watch diet stay active - Lipid panel - Hepatic function panel  3. Other fatigue Significant fatigue tiredness check CBC probably related into the recent loss of job and some of the stress going on there - EKG [id number] - Basic metabolic panel - CBC with Differential/Platelet  4. Dyspnea Remote history of smoking no longer smokes but he notices getting short of breath with activity denies chest pressure tightness or pain I believe the patient would benefit from pulmonary function testing. EKG looks good. - EKG 12-Lead - Pulmonary function test  5. Screening Screening for prostate cancer - PSA  6. Anxiety Stress and anxiety related issues I doubt depression but he does have some indicators of depression he denies being suicidal we will try Zoloft 50 mg one half daily for the first week then 1 daily her follow-up again in a proximally 2-3 months. Sooner if depression issues or if  worse patient encouraged to follow-up in August for a wellness visit

## 2015-07-11 ENCOUNTER — Encounter: Payer: Self-pay | Admitting: Family Medicine

## 2015-07-24 ENCOUNTER — Telehealth: Payer: Self-pay | Admitting: Family Medicine

## 2015-07-24 ENCOUNTER — Other Ambulatory Visit: Payer: Self-pay | Admitting: *Deleted

## 2015-07-24 MED ORDER — SERTRALINE HCL 50 MG PO TABS: 50.0000 mg | ORAL_TABLET | Freq: Every day | ORAL | Status: DC

## 2015-07-24 NOTE — Telephone Encounter (Signed)
Pt is requesting a 90 day refill on his sertraline (ZOLOFT) 50 MG tablet. Pt was told by Dr. Nicki Reaper to call and let him know if it was working for him. It is working just fine.     [place], [place]

## 2015-07-24 NOTE — Telephone Encounter (Signed)
Refill sent to pharm. Pt notified  

## 2015-07-26 ENCOUNTER — Ambulatory Visit (HOSPITAL_COMMUNITY)
Admission: RE | Admit: 2015-07-26 | Discharge: 2015-07-26 | Disposition: A | Discharge status: Home / Self Care | Source: Ambulatory Visit | Attending: Family Medicine | Admitting: Family Medicine

## 2015-07-26 DIAGNOSIS — R0600 Dyspnea, unspecified: Secondary | ICD-10-CM | POA: Insufficient documentation

## 2015-07-26 LAB — PULMONARY FUNCTION TEST
FEF 25-75 Post: 2.39 L/sec
FEF 25-75 Pre: 2.22 L/sec
FEF2575-%Change-Post: 7 %
FEF2575-%Pred-Post: 85 %
FEF2575-%Pred-Pre: 79 %
FEV1-%Change-Post: 2 %
FEV1-%Pred-Post: 81 %
FEV1-%Pred-Pre: 79 %
FEV1-Post: 2.65 L
FEV1-Pre: 2.58 L
FEV1FVC-%CHANGE-POST: 0 %
FEV1FVC-%Pred-Pre: 98 %
FEV6-%Change-Post: 3 %
FEV6-%PRED-PRE: 85 %
FEV6-%Pred-Post: 87 %
FEV6-PRE: 3.44 L
FEV6-Post: 3.55 L
FEV6FVC-%Pred-Post: 104 %
FEV6FVC-%Pred-Pre: 104 %
FVC-%Change-Post: 3 %
FVC-%PRED-POST: 83 %
FVC-%PRED-PRE: 81 %
FVC-POST: 3.55 L
FVC-PRE: 3.44 L
POST FEV1/FVC RATIO: 75 %
POST FEV6/FVC RATIO: 100 %
PRE FEV1/FVC RATIO: 75 %
Pre FEV6/FVC Ratio: 100 %

## 2015-07-26 MED ORDER — ALBUTEROL SULFATE (2.5 MG/3ML) 0.083% IN NEBU
2.5000 mg | INHALATION_SOLUTION | Freq: Once | RESPIRATORY_TRACT | Status: AC
  Administered 2015-07-26: 2.5 mg via RESPIRATORY_TRACT

## 2015-07-27 LAB — LIPID PANEL
Chol/HDL Ratio: 3.4 ratio units (ref 0.0–5.0)
Cholesterol, Total: 160 mg/dL (ref 100–199)
HDL: 47 mg/dL (ref >39)
LDL Calculated: 96 mg/dL (ref 0–99)
TRIGLYCERIDES: 87 mg/dL (ref 0–149)
VLDL CHOLESTEROL CAL: 17 mg/dL (ref 5–40)

## 2015-07-27 LAB — CBC WITH DIFFERENTIAL/PLATELET
BASOS: 0 %
Basophils Absolute: 0 10*3/uL (ref 0.0–0.2)
EOS (ABSOLUTE): 0.1 10*3/uL (ref 0.0–0.4)
EOS: 2 %
HEMATOCRIT: 44.3 % (ref 37.5–51.0)
Hemoglobin: 15.6 g/dL (ref 12.6–17.7)
Immature Grans (Abs): 0 10*3/uL (ref 0.0–0.1)
Immature Granulocytes: 0 %
LYMPHS ABS: 1.3 10*3/uL (ref 0.7–3.1)
Lymphs: 21 %
MCH: 29.6 pg (ref 26.6–33.0)
MCHC: 35.2 g/dL (ref 31.5–35.7)
MCV: 84 fL (ref 79–97)
MONOS ABS: 0.5 10*3/uL (ref 0.1–0.9)
Monocytes: 8 %
Neutrophils Absolute: 4.4 10*3/uL (ref 1.4–7.0)
Neutrophils: 69 %
Platelets: 204 10*3/uL (ref 150–379)
RBC: 5.27 x10E6/uL (ref 4.14–5.80)
RDW: 14 % (ref 12.3–15.4)
WBC: 6.4 10*3/uL (ref 3.4–10.8)

## 2015-07-27 LAB — HEPATIC FUNCTION PANEL
ALT: 14 IU/L (ref 0–44)
AST: 16 IU/L (ref 0–40)
Albumin: 4.3 g/dL (ref 3.5–5.5)
Alkaline Phosphatase: 81 IU/L (ref 39–117)
BILIRUBIN, DIRECT: 0.13 mg/dL (ref 0.00–0.40)
Bilirubin Total: 0.5 mg/dL (ref 0.0–1.2)
TOTAL PROTEIN: 6.8 g/dL (ref 6.0–8.5)

## 2015-07-27 LAB — BASIC METABOLIC PANEL
BUN / CREAT RATIO: 13 (ref 9–20)
BUN: 17 mg/dL (ref 6–24)
CHLORIDE: 104 mmol/L (ref 96–106)
CO2: 22 mmol/L (ref 18–29)
CREATININE: 1.34 mg/dL — AB (ref 0.76–1.27)
Calcium: 9 mg/dL (ref 8.7–10.2)
GFR calc Af Amer: 68 mL/min/{1.73_m2} (ref >59)
GFR calc non Af Amer: 59 mL/min/{1.73_m2} — ABNORMAL LOW (ref >59)
GLUCOSE: 99 mg/dL (ref 65–99)
POTASSIUM: 4.5 mmol/L (ref 3.5–5.2)
SODIUM: 144 mmol/L (ref 134–144)

## 2015-07-27 LAB — PSA: Prostate Specific Ag, Serum: 0.2 ng/mL (ref 0.0–4.0)

## 2015-07-30 ENCOUNTER — Encounter: Payer: Self-pay | Admitting: Family Medicine

## 2015-09-07 ENCOUNTER — Ambulatory Visit: Payer: TRICARE For Life (TFL) | Admitting: Neurology

## 2015-10-04 ENCOUNTER — Other Ambulatory Visit: Payer: Self-pay | Admitting: Family Medicine

## 2015-10-09 ENCOUNTER — Ambulatory Visit (HOSPITAL_COMMUNITY)
Admission: RE | Admit: 2015-10-09 | Discharge: 2015-10-09 | Disposition: A | Discharge status: Home / Self Care | Source: Ambulatory Visit | Attending: Family Medicine | Admitting: Family Medicine

## 2015-10-09 ENCOUNTER — Ambulatory Visit (INDEPENDENT_AMBULATORY_CARE_PROVIDER_SITE_OTHER): Admitting: Family Medicine

## 2015-10-09 ENCOUNTER — Encounter: Payer: Self-pay | Admitting: Family Medicine

## 2015-10-09 VITALS — BP 120/78 | Ht 66.0 in | Wt 199.2 lb

## 2015-10-09 DIAGNOSIS — R61 Generalized hyperhidrosis: Secondary | ICD-10-CM

## 2015-10-09 DIAGNOSIS — N289 Disorder of kidney and ureter, unspecified: Secondary | ICD-10-CM

## 2015-10-09 DIAGNOSIS — E785 Hyperlipidemia, unspecified: Secondary | ICD-10-CM

## 2015-10-09 DIAGNOSIS — Z139 Encounter for screening, unspecified: Secondary | ICD-10-CM | POA: Diagnosis not present

## 2015-10-09 DIAGNOSIS — IMO0001 Reserved for inherently not codable concepts without codable children: Secondary | ICD-10-CM

## 2015-10-09 DIAGNOSIS — Z0000 Encounter for general adult medical examination without abnormal findings: Secondary | ICD-10-CM

## 2015-10-09 DIAGNOSIS — R918 Other nonspecific abnormal finding of lung field: Secondary | ICD-10-CM | POA: Insufficient documentation

## 2015-10-09 DIAGNOSIS — R9389 Abnormal findings on diagnostic imaging of other specified body structures: Secondary | ICD-10-CM

## 2015-10-09 LAB — POCT URINALYSIS DIPSTICK
PROTEIN UA: 30
SPEC GRAV UA: 1.02
pH, UA: 6

## 2015-10-09 MED ORDER — SERTRALINE HCL 100 MG PO TABS: 100.0000 mg | ORAL_TABLET | Freq: Every day | ORAL | 1 refills | Status: DC
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
## 2015-10-12 NOTE — Addendum Note (Signed)
Addended by: Dairl Ponder on: 10/12/2015 10:47 AM   Modules accepted: Orders

## 2015-10-13 LAB — BASIC METABOLIC PANEL
BUN/Creatinine Ratio: 12 (ref 9–20)
BUN: 17 mg/dL (ref 6–24)
CALCIUM: 9.3 mg/dL (ref 8.7–10.2)
CHLORIDE: 98 mmol/L (ref 96–106)
CO2: 25 mmol/L (ref 18–29)
Creatinine, Ser: 1.4 mg/dL — ABNORMAL HIGH (ref 0.76–1.27)
GFR calc non Af Amer: 56 mL/min/{1.73_m2} — ABNORMAL LOW (ref >59)
GFR, EST AFRICAN AMERICAN: 64 mL/min/{1.73_m2} (ref >59)
Glucose: 101 mg/dL — ABNORMAL HIGH (ref 65–99)
POTASSIUM: 4.6 mmol/L (ref 3.5–5.2)
SODIUM: 141 mmol/L (ref 134–144)

## 2015-10-13 LAB — HIV ANTIBODY (ROUTINE TESTING W REFLEX): HIV SCREEN 4TH GENERATION: NONREACTIVE

## 2015-10-13 LAB — TSH: TSH: 2.71 u[IU]/mL (ref 0.450–4.500)

## 2015-10-13 LAB — HEPATITIS C ANTIBODY

## 2015-10-18 ENCOUNTER — Encounter: Payer: Self-pay | Admitting: Family Medicine

## 2015-11-09 NOTE — Addendum Note (Signed)
Addended by: Dairl Ponder on: 11/09/2015 11:02 AM   Modules accepted: Orders

## 2015-11-16 LAB — PROTEIN, URINE, 24 HOUR
PROTEIN 24H UR: 52 mg/(24.h) (ref 30–150)
PROTEIN UR: 10.4 mg/dL

## 2015-11-16 LAB — CREATININE, URINE, 24 HOUR
Creatinine, 24H Ur: 772 mg/24 hr — ABNORMAL LOW (ref 1000–2000)
Creatinine, Urine: 154.4 mg/dL

## 2015-11-19 ENCOUNTER — Encounter: Payer: Self-pay | Admitting: Family Medicine

## 2015-12-27 ENCOUNTER — Encounter: Payer: Self-pay | Admitting: Family Medicine

## 2015-12-27 ENCOUNTER — Ambulatory Visit (INDEPENDENT_AMBULATORY_CARE_PROVIDER_SITE_OTHER): Admitting: Family Medicine

## 2015-12-27 VITALS — BP 120/74 | Temp 98.5°F | Ht 66.0 in | Wt 205.1 lb

## 2015-12-27 DIAGNOSIS — J329 Chronic sinusitis, unspecified: Secondary | ICD-10-CM | POA: Diagnosis not present

## 2015-12-27 DIAGNOSIS — J310 Chronic rhinitis: Secondary | ICD-10-CM

## 2015-12-27 MED ORDER — AMOXICILLIN-POT CLAVULANATE 875-125 MG PO TABS: 1.0000 | ORAL_TABLET | Freq: Two times a day (BID) | ORAL | 0 refills | Status: DC

## 2015-12-27 MED ORDER — AMOXICILLIN-POT CLAVULANATE 875-125 MG PO TABS
1.0000 | ORAL_TABLET | Freq: Two times a day (BID) | ORAL | 0 refills | Status: AC
Start: 2015-12-27 — End: 2016-01-06

## 2015-12-27 NOTE — Progress Notes (Signed)
   Subjective:    Patient ID: Wayne [name], male    DOB: 02/07/1961, 55 y.o.   MRN: [id number]  Sinusitis  This is a new problem. The current episode started in the past 7 days. The problem is unchanged. There has been no fever. The pain is moderate. Associated symptoms include congestion, coughing, ear pain, headaches and a sore throat. (Body aches) Treatments tried: Alka Seltzer Cold. The treatment provided no relief.   Patient has no other concerns at this time.   Right ear and sinus cong and cough   Dim energy   No resp mess around      Review of Systems  HENT: Positive for congestion, ear pain and sore throat.   Respiratory: Positive for cough.   Neurological: Positive for headaches.       Objective:   Physical Exam Alert, mild malaise. Hydration good Vitals stable. frontal/ maxillary tenderness evident positive nasal congestion. pharynx normal neck supple  lungs clear/no crackles or wheezes. heart regular in rhythm        Assessment & Plan:  Impression rhinosinusitis likely post viral, discussed with patient. plan antibiotics prescribed. Questions answered. Symptomatic care discussed. warning signs discussed. WSL The

## 2016-02-01 ENCOUNTER — Other Ambulatory Visit: Payer: Self-pay | Admitting: Family Medicine

## 2016-02-26 ENCOUNTER — Ambulatory Visit (INDEPENDENT_AMBULATORY_CARE_PROVIDER_SITE_OTHER): Admitting: Family Medicine

## 2016-02-26 VITALS — BP 108/82 | Temp 98.8°F | Wt 199.4 lb

## 2016-02-26 DIAGNOSIS — R300 Dysuria: Secondary | ICD-10-CM

## 2016-02-26 DIAGNOSIS — R319 Hematuria, unspecified: Secondary | ICD-10-CM

## 2016-02-26 LAB — POCT URINALYSIS DIPSTICK
BILIRUBIN UA: NEGATIVE
Blood, UA: POSITIVE
GLUCOSE UA: NEGATIVE
KETONES UA: NEGATIVE
LEUKOCYTES UA: NEGATIVE
Nitrite, UA: NEGATIVE
PH UA: 7
Protein, UA: NEGATIVE
Spec Grav, UA: 1.015
Urobilinogen, UA: 0.2

## 2016-02-26 MED ORDER — CEFPROZIL 500 MG PO TABS: 500.0000 mg | ORAL_TABLET | Freq: Two times a day (BID) | ORAL | 0 refills | Status: DC

## 2016-02-26 NOTE — Progress Notes (Signed)
Subjective:    Patient ID: Wayne [name], male    DOB: 12/01/1960, 56 y.o.   MRN: [id number]  HPI  Patient in office today c/o urinary frequency, dysuria, weak stream for the past week.  He c/o visible blood in urine and he passed a "brown speck". The visible blood in the urine happened over the weekend. He c/o lower abdominal pain.  He denies fever. He denies flank pain or discomfort. No history of kidney stones. No history of prostate infections. No personal history of cancers. This patient states that he had some urinary frequency slight change in the flow Denies weight loss fever sweats chills Review of Systems Denies chest tightness pressure pain shortness of breath denies flank pain denies current dysuria   PSA earlier this year normal Objective:   Physical Exam Lungs are clear no crackles heart is regular pulse normal abdomen is soft no masses felt flank nontender Prostate exam normal Urinalysis with occasional RBC occasional WBC    25 minutes was spent with the patient. Greater than half the time was spent in discussion and answering questions and counseling regarding the issues that the patient came in for today.  Assessment & Plan:  We will send urine culture CBC met 7 ordered Will set up CT scan of the abdomen and pelvis without contrast because of hematuria May need referral to urology Await the results of these tests 

## 2016-02-27 LAB — BASIC METABOLIC PANEL
BUN/Creatinine Ratio: 14 (ref 9–20)
BUN: 18 mg/dL (ref 6–24)
CO2: 25 mmol/L (ref 18–29)
Calcium: 9.4 mg/dL (ref 8.7–10.2)
Chloride: 97 mmol/L (ref 96–106)
Creatinine, Ser: 1.26 mg/dL (ref 0.76–1.27)
GFR calc Af Amer: 73 mL/min/{1.73_m2} (ref >59)
GFR calc non Af Amer: 63 mL/min/{1.73_m2} (ref >59)
GLUCOSE: 84 mg/dL (ref 65–99)
POTASSIUM: 4.7 mmol/L (ref 3.5–5.2)
SODIUM: 139 mmol/L (ref 134–144)

## 2016-02-27 LAB — CBC WITH DIFFERENTIAL/PLATELET
Basophils Absolute: 0 10*3/uL (ref 0.0–0.2)
Basos: 0 %
EOS (ABSOLUTE): 0.1 10*3/uL (ref 0.0–0.4)
Eos: 2 %
Hematocrit: 46.7 % (ref 37.5–51.0)
Hemoglobin: 16 g/dL (ref 13.0–17.7)
IMMATURE GRANULOCYTES: 1 %
Immature Grans (Abs): 0 10*3/uL (ref 0.0–0.1)
LYMPHS ABS: 1.7 10*3/uL (ref 0.7–3.1)
Lymphs: 23 %
MCH: 29 pg (ref 26.6–33.0)
MCHC: 34.3 g/dL (ref 31.5–35.7)
MCV: 85 fL (ref 79–97)
MONOS ABS: 0.7 10*3/uL (ref 0.1–0.9)
Monocytes: 10 %
Neutrophils Absolute: 4.8 10*3/uL (ref 1.4–7.0)
Neutrophils: 64 %
PLATELETS: 254 10*3/uL (ref 150–379)
RBC: 5.52 x10E6/uL (ref 4.14–5.80)
RDW: 13.5 % (ref 12.3–15.4)
WBC: 7.4 10*3/uL (ref 3.4–10.8)

## 2016-03-01 LAB — CULTURE, URINE COMPREHENSIVE

## 2016-03-12 ENCOUNTER — Telehealth: Payer: Self-pay | Admitting: *Deleted

## 2016-03-15 ENCOUNTER — Encounter: Payer: Self-pay | Admitting: Internal Medicine

## 2016-03-19 ENCOUNTER — Ambulatory Visit (HOSPITAL_COMMUNITY): Admission: RE | Admit: 2016-03-19 | Source: Ambulatory Visit

## 2016-03-20 ENCOUNTER — Other Ambulatory Visit: Payer: Self-pay | Admitting: Family Medicine

## 2016-09-27 ENCOUNTER — Ambulatory Visit (INDEPENDENT_AMBULATORY_CARE_PROVIDER_SITE_OTHER): Payer: TRICARE For Life (TFL) | Admitting: Family Medicine

## 2016-09-27 ENCOUNTER — Encounter: Payer: Self-pay | Admitting: Family Medicine

## 2016-09-27 VITALS — BP 114/72 | Ht 66.0 in | Wt 193.0 lb

## 2016-09-27 DIAGNOSIS — K219 Gastro-esophageal reflux disease without esophagitis: Secondary | ICD-10-CM | POA: Diagnosis not present

## 2016-09-27 DIAGNOSIS — E7849 Other hyperlipidemia: Secondary | ICD-10-CM

## 2016-09-27 DIAGNOSIS — E784 Other hyperlipidemia: Secondary | ICD-10-CM

## 2016-09-27 MED ORDER — PANTOPRAZOLE SODIUM 40 MG PO TBEC: 40.0000 mg | DELAYED_RELEASE_TABLET | Freq: Every day | ORAL | 1 refills | Status: DC

## 2016-09-27 MED ORDER — PAROXETINE HCL 40 MG PO TABS: 40.0000 mg | ORAL_TABLET | ORAL | 1 refills | Status: DC

## 2016-09-27 MED ORDER — ATORVASTATIN CALCIUM 20 MG PO TABS: 20.0000 mg | ORAL_TABLET | Freq: Every day | ORAL | 1 refills | Status: DC

## 2016-09-27 NOTE — Patient Instructions (Signed)
Tick Bite Information Introduction Ticks are insects that attach themselves to the skin. There are many types of ticks. Common types include wood ticks and deer ticks. Sometimes, ticks carry diseases that can make a person very ill. The most common places for ticks to attach themselves are the scalp, neck, armpits, waist, and groin. HOW CAN YOU PREVENT TICK BITES? Take these steps to help prevent tick bites when you are outdoors:  Wear long sleeves and long pants.  Wear white clothes so you can see ticks more easily.  Tuck your pant legs into your socks.  If walking on a trail, stay in the middle of the trail to avoid brushing against bushes.  Avoid walking through areas with long grass.  Put bug spray on all skin that is showing and along boot tops, pant legs, and sleeve cuffs.  Check clothes, hair, and skin often and before going inside.  Brush off any ticks that are not attached.  Take a shower or bath as soon as possible after being outdoors.  HOW SHOULD YOU REMOVE A TICK? Ticks should be removed as soon as possible to help prevent diseases. 1. If latex gloves are available, put them on before trying to remove a tick. 2. Use tweezers to grasp the tick as close to the skin as possible. You may also use curved forceps or a tick removal tool. Grasp the tick as close to its head as possible. Avoid grasping the tick on its body. 3. Pull gently upward until the tick lets go. Do not twist the tick or jerk it suddenly. This may break off the tick's head or mouth parts. 4. Do not squeeze or crush the tick's body. This could force disease-carrying fluids from the tick into your body. 5. After the tick is removed, wash the bite area and your hands with soap and water or alcohol. 6. Apply a small amount of antiseptic cream or ointment to the bite site. 7. Wash any tools that were used.  Do not try to remove a tick by applying a hot match, petroleum jelly, or fingernail polish to the tick.  These methods do not work. They may also increase the chances of disease being spread from the tick bite. WHEN SHOULD YOU SEEK HELP? Contact your health care provider if you are unable to remove a tick or if a part of the tick breaks off in the skin. After a tick bite, you need to watch for signs and symptoms of diseases that can be spread by ticks. Contact your health care provider if you develop any of the following:  Fever.  Rash.  Redness and puffiness (swelling) in the area of the tick bite.  Tender, puffy lymph glands.  Watery poop (diarrhea).  Weight loss.  Cough.  Feeling more tired than normal (fatigue).  Muscle, joint, or bone pain.  Belly (abdominal) pain.  Headache.  Change in your level of consciousness.  Trouble walking or moving your legs.  Loss of feeling (numbness) in the legs.  Loss of movement (paralysis).  Shortness of breath.  Confusion.  Throwing up (vomiting) many times.  This information is not intended to replace advice given to you by your health care provider. Make sure you discuss any questions you have with your health care provider. Document Released: 04/24/2009 Document Revised: 07/06/2015 Document Reviewed: 07/08/2012 Elsevier Interactive Patient Education  2018 [employer]  

## 2016-09-27 NOTE — Progress Notes (Signed)
   Subjective:    Patient ID: Wayne [name], male    DOB: Oct 08, 1960, 56 y.o.   MRN: [id number]  HPIHyperlipidemia. Pt has been going to [place] and wants to switch back. Working on diet and exercise. Takes meds every day. He states stress levels are doing much better. Paxil seems to be working great for him. He also states reflux under good control with his medication. Has two tick bites from May that are healing.   Tick bites did not cause any large redness did not cause any fever muscle aches headaches fever chills or sweats He states this occurred a couple different times in in the past couple months. Not severe no redness with it Review of Systems  Constitutional: Negative for activity change, fatigue and fever.  Respiratory: Negative for cough and shortness of breath.   Cardiovascular: Negative for chest pain and leg swelling.  Neurological: Negative for headaches.       Objective:   Physical Exam  Constitutional: He appears well-nourished. No distress.  Cardiovascular: Normal rate, regular rhythm and normal heart sounds.   No murmur heard. Pulmonary/Chest: Effort normal and breath sounds normal. No respiratory distress.  Musculoskeletal: He exhibits no edema.  Lymphadenopathy:    He has no cervical adenopathy.  Neurological: He is alert.  Psychiatric: His behavior is normal.  Vitals reviewed.   2 areas one on leg one on shoulder were tick bite occurred no sign of infection      Assessment & Plan:  Hyperlipidemia patient Wayne [name] the lab work from his [facility] to [facility]-refills given-follow-up 6 months  Reflux symptoms do well with medication continue medicine  Generalized anxiety disorder with mild depression symptoms doing well on Paxil continue this  Follow-up 6 months for medicine check as well as wellness

## 2016-10-29 IMAGING — DX DG CHEST 2V
2 series · 2 of 2 positions shown · non-contrast
Comparison: PA and lateral chest x-ray December 04, 2011

CLINICAL DATA: Chronic night sweats with increasing severity.
Discontinued smoking 4 years ago

EXAM:
CHEST  2 VIEW

[chest pa]
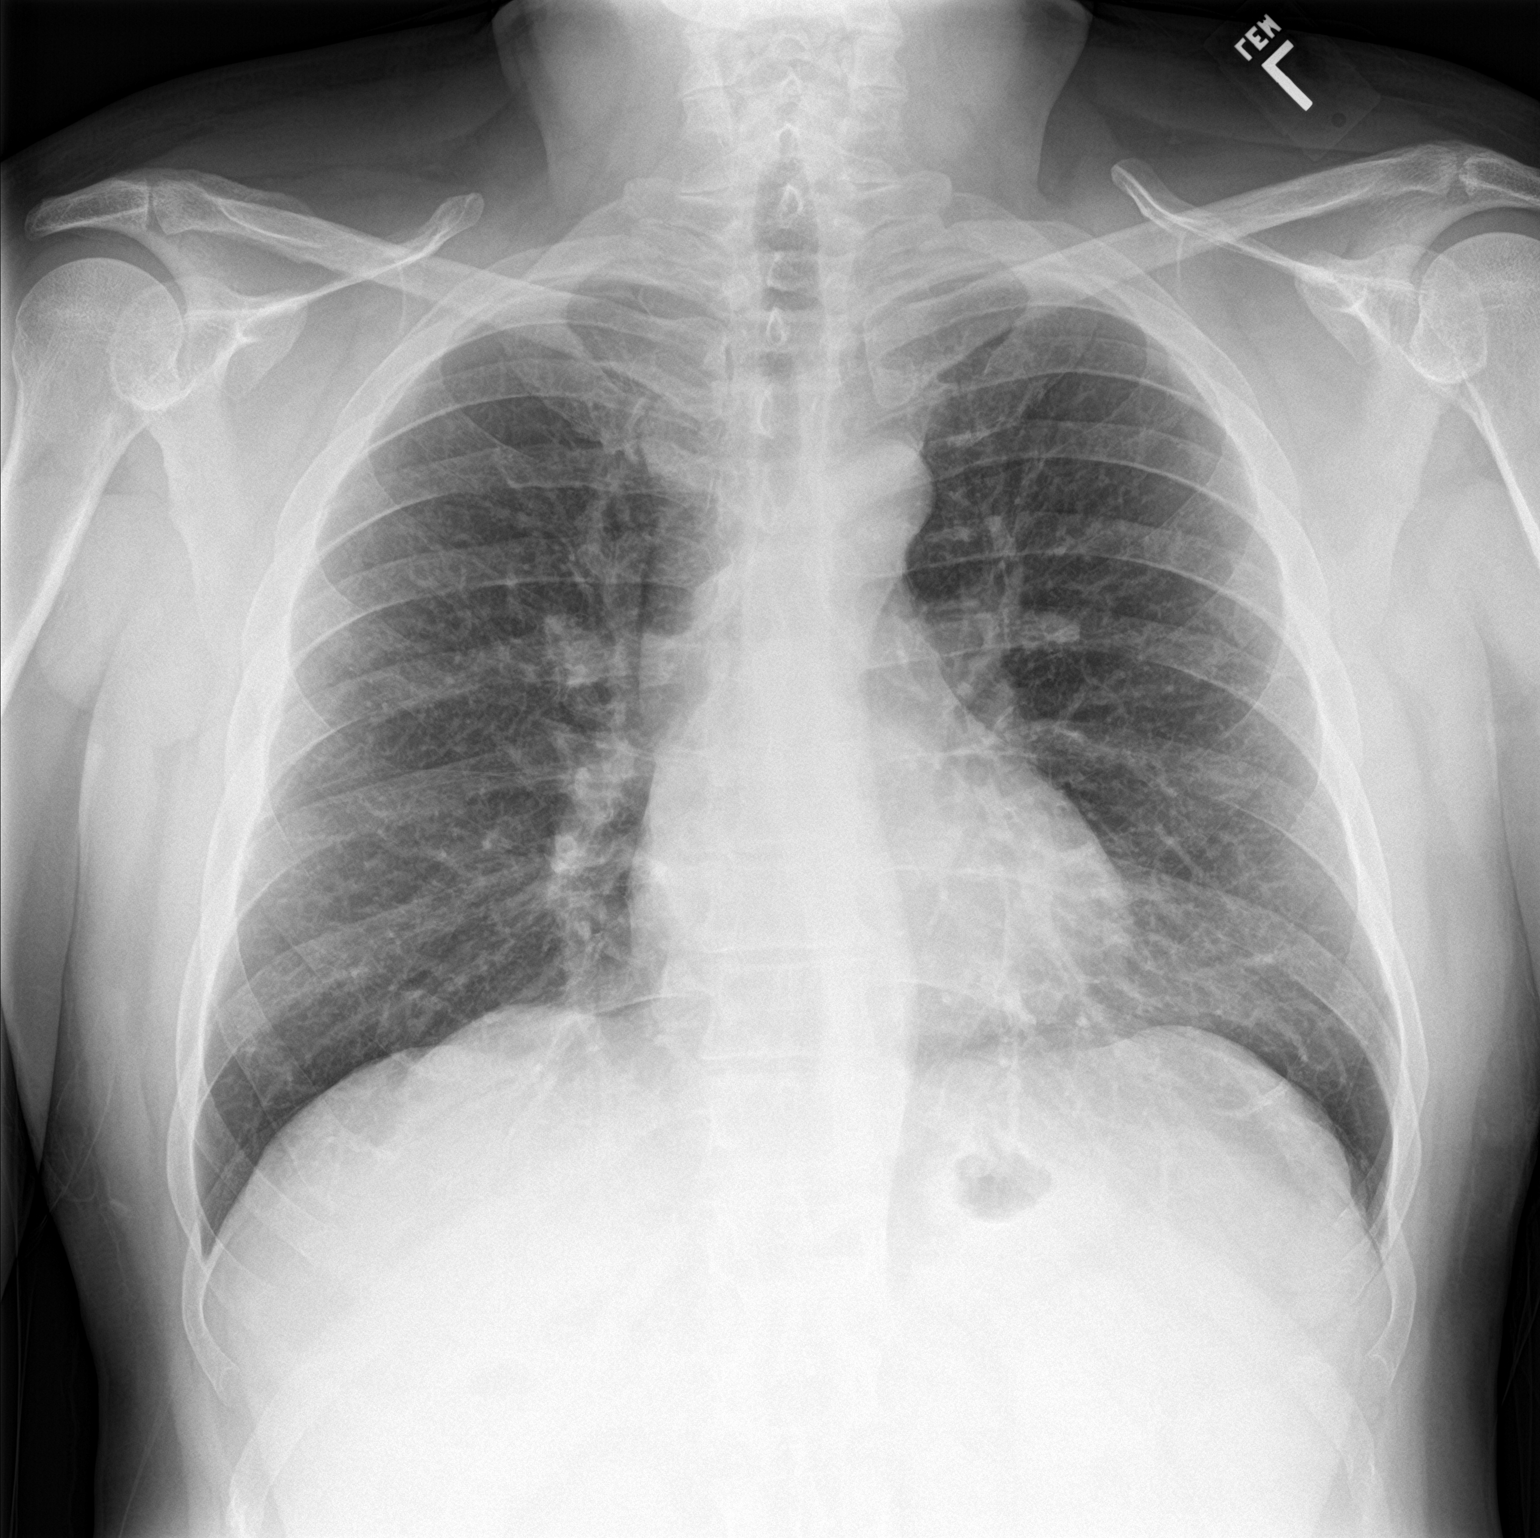

[chest lat]
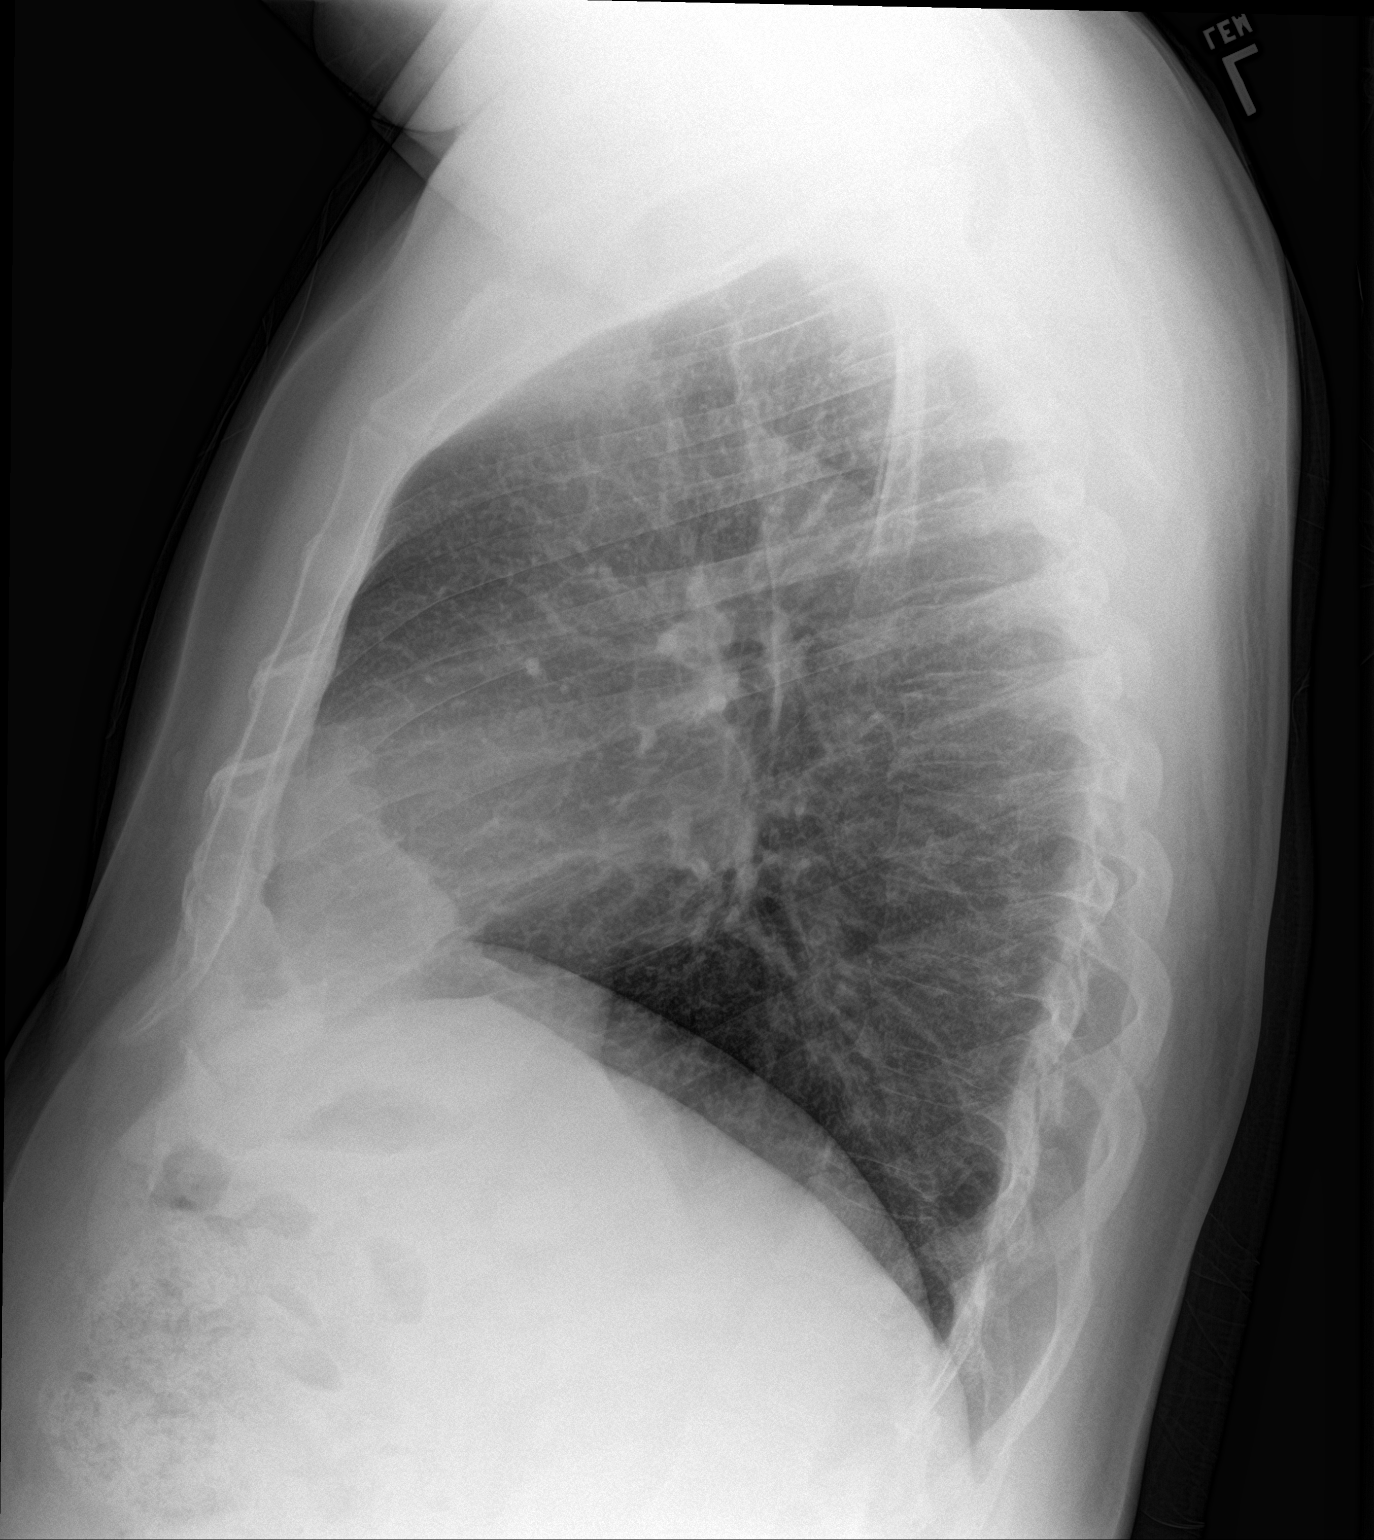

[2 of 2 positions shown; findings below may reference images not displayed]

FINDINGS: The lungs are well-expanded. The interstitial markings are coarse
and are more conspicuous than on the previous study. There is no
alveolar infiltrate. No pulmonary parenchymal nodules or masses are
observed. There is no pleural effusion or pneumothorax. The heart
and pulmonary vascularity are normal. The mediastinum is normal in
width.
IMPRESSION: Increased interstitial markings bilaterally which is a change since
the previous study. No alveolar pneumonia, parenchymal mass, nor
other acute cardiopulmonary abnormality. No mediastinal or hilar
lymphadenopathy is observed.

## 2017-07-16 ENCOUNTER — Other Ambulatory Visit: Payer: Self-pay | Admitting: Family Medicine

## 2017-07-16 NOTE — Telephone Encounter (Signed)
May have this +1 refill each needs follow-up office visit

## 2017-10-24 ENCOUNTER — Telehealth: Payer: Self-pay | Admitting: Family Medicine

## 2017-10-24 DIAGNOSIS — Z79899 Other long term (current) drug therapy: Secondary | ICD-10-CM

## 2017-10-24 DIAGNOSIS — E7849 Other hyperlipidemia: Secondary | ICD-10-CM

## 2017-10-24 DIAGNOSIS — Z125 Encounter for screening for malignant neoplasm of prostate: Secondary | ICD-10-CM

## 2017-10-24 NOTE — Telephone Encounter (Signed)
Labs placed and left a message to r/c.

## 2017-10-24 NOTE — Telephone Encounter (Signed)
Last labs 02/2016: CBC and Met 7

## 2017-10-24 NOTE — Telephone Encounter (Signed)
Pt would like lab orders placed for his physical next month.

## 2017-10-24 NOTE — Telephone Encounter (Signed)
Lipid, liver, metabolic 7, PSA 

## 2017-10-28 NOTE — Telephone Encounter (Signed)
Left message to return call 

## 2017-10-28 NOTE — Telephone Encounter (Signed)
Patient is aware 

## 2017-10-30 ENCOUNTER — Encounter: Payer: Self-pay | Admitting: Family Medicine

## 2017-10-30 ENCOUNTER — Ambulatory Visit (INDEPENDENT_AMBULATORY_CARE_PROVIDER_SITE_OTHER): Admitting: Family Medicine

## 2017-10-30 VITALS — BP 110/72 | Ht 66.0 in | Wt 200.0 lb

## 2017-10-30 DIAGNOSIS — L723 Sebaceous cyst: Secondary | ICD-10-CM | POA: Diagnosis not present

## 2017-10-30 DIAGNOSIS — E7849 Other hyperlipidemia: Secondary | ICD-10-CM | POA: Diagnosis not present

## 2017-10-30 DIAGNOSIS — F419 Anxiety disorder, unspecified: Secondary | ICD-10-CM | POA: Diagnosis not present

## 2017-10-30 MED ORDER — PANTOPRAZOLE SODIUM 40 MG PO TBEC: 40.0000 mg | DELAYED_RELEASE_TABLET | Freq: Every day | ORAL | 3 refills | Status: DC

## 2017-10-30 MED ORDER — PAROXETINE HCL 40 MG PO TABS: 40.0000 mg | ORAL_TABLET | Freq: Every morning | ORAL | 3 refills | Status: DC

## 2017-10-30 NOTE — Progress Notes (Signed)
Marked as not taking on med list

## 2017-10-30 NOTE — Progress Notes (Signed)
   Subjective:    Patient ID: Wayne [name], male    DOB: Aug 14, 1960, 57 y.o.   MRN: [id number]  HPI Patient is here today with complaints of a cyst on his back.He states its been there for a few months now. It does not hurt. He does state this area it is getting a little bit larger He denies any other major issues He does need refills of his medication The Paxil does help him with stress denies being depressed he would like to continue the medicine He is no longer taking cholesterol medicine he is just watching his diet he is hoping he does not have to be on more medicine he will be getting some lab work in the near future with a follow-up wellness visit   Review of Systems  Constitutional: Negative for activity change, fatigue and fever.  HENT: Negative for congestion and rhinorrhea.   Respiratory: Negative for cough and shortness of breath.   Cardiovascular: Negative for chest pain and leg swelling.  Gastrointestinal: Negative for abdominal pain, diarrhea and nausea.  Genitourinary: Negative for dysuria and hematuria.  Neurological: Negative for weakness and headaches.  Psychiatric/Behavioral: Negative for agitation and behavioral problems.       Objective:   Physical Exam  Constitutional: He appears well-nourished. No distress.  HENT:  Head: Normocephalic and atraumatic.  Eyes: Right eye exhibits no discharge. Left eye exhibits no discharge.  Neck: No tracheal deviation present.  Cardiovascular: Normal rate, regular rhythm and normal heart sounds.  No murmur heard. Pulmonary/Chest: Effort normal and breath sounds normal. No respiratory distress.  Musculoskeletal: He exhibits no edema.  Lymphadenopathy:    He has no cervical adenopathy.  Neurological: He is alert. Coordination normal.  Skin: Skin is warm and dry.  Psychiatric: He has a normal mood and affect. His behavior is normal.  Vitals reviewed.   Patient has a sebaceous cyst on the mid back region.  Not  infected      Assessment & Plan:  Sebaceous cyst referral to dermatology for removal  Generalized anxiety does well on Paxil moods are doing well continue current measures  Hyperlipidemia patient is chosen to stay off of Lipitor currently.  He will be checking his lab work.  He will follow-up for a wellness checkup coming up

## 2017-10-31 LAB — LIPID PANEL
Chol/HDL Ratio: 4.9 ratio (ref 0.0–5.0)
Cholesterol, Total: 207 mg/dL — ABNORMAL HIGH (ref 100–199)
HDL: 42 mg/dL (ref >39)
LDL Calculated: 145 mg/dL — ABNORMAL HIGH (ref 0–99)
Triglycerides: 98 mg/dL (ref 0–149)
VLDL Cholesterol Cal: 20 mg/dL (ref 5–40)

## 2017-10-31 LAB — BASIC METABOLIC PANEL
BUN/Creatinine Ratio: 17 (ref 9–20)
BUN: 20 mg/dL (ref 6–24)
CALCIUM: 9.1 mg/dL (ref 8.7–10.2)
CHLORIDE: 107 mmol/L — AB (ref 96–106)
CO2: 24 mmol/L (ref 20–29)
Creatinine, Ser: 1.2 mg/dL (ref 0.76–1.27)
GFR calc non Af Amer: 66 mL/min/{1.73_m2} (ref >59)
GFR, EST AFRICAN AMERICAN: 77 mL/min/{1.73_m2} (ref >59)
Glucose: 97 mg/dL (ref 65–99)
POTASSIUM: 4.7 mmol/L (ref 3.5–5.2)
SODIUM: 146 mmol/L — AB (ref 134–144)

## 2017-10-31 LAB — HEPATIC FUNCTION PANEL
ALT: 14 IU/L (ref 0–44)
AST: 18 IU/L (ref 0–40)
Albumin: 4.6 g/dL (ref 3.5–5.5)
Alkaline Phosphatase: 77 IU/L (ref 39–117)
BILIRUBIN TOTAL: 0.5 mg/dL (ref 0.0–1.2)
Bilirubin, Direct: 0.14 mg/dL (ref 0.00–0.40)
Total Protein: 7.2 g/dL (ref 6.0–8.5)

## 2017-10-31 LAB — PSA: PROSTATE SPECIFIC AG, SERUM: 0.3 ng/mL (ref 0.0–4.0)

## 2017-11-04 ENCOUNTER — Encounter: Payer: Self-pay | Admitting: Family Medicine

## 2017-11-18 ENCOUNTER — Encounter: Payer: Self-pay | Admitting: Family Medicine

## 2017-11-18 ENCOUNTER — Ambulatory Visit (INDEPENDENT_AMBULATORY_CARE_PROVIDER_SITE_OTHER): Admitting: Family Medicine

## 2017-11-18 VITALS — BP 110/74 | Ht 66.0 in | Wt 200.8 lb

## 2017-11-18 DIAGNOSIS — E7849 Other hyperlipidemia: Secondary | ICD-10-CM | POA: Diagnosis not present

## 2017-11-18 DIAGNOSIS — Z0000 Encounter for general adult medical examination without abnormal findings: Secondary | ICD-10-CM | POA: Diagnosis not present

## 2017-11-18 DIAGNOSIS — Z79899 Other long term (current) drug therapy: Secondary | ICD-10-CM

## 2017-11-18 DIAGNOSIS — Z23 Encounter for immunization: Secondary | ICD-10-CM | POA: Diagnosis not present

## 2017-11-18 MED ORDER — ATORVASTATIN CALCIUM 20 MG PO TABS: 20.0000 mg | ORAL_TABLET | Freq: Every day | ORAL | 1 refills | Status: AC

## 2017-11-18 NOTE — Patient Instructions (Addendum)
Results for orders placed or performed in visit on 00/93/81  Basic metabolic panel  Result Value Ref Range   Glucose 97 65 - 99 mg/dL   BUN 20 6 - 24 mg/dL   Creatinine, Ser 1.20 0.76 - 1.27 mg/dL   GFR calc non Af Amer 66 >59 mL/min/1.73   GFR calc Af Amer 77 >59 mL/min/1.73   BUN/Creatinine Ratio 17 9 - 20   Sodium 146 (H) 134 - 144 mmol/L   Potassium 4.7 3.5 - 5.2 mmol/L   Chloride 107 (H) 96 - 106 mmol/L   CO2 24 20 - 29 mmol/L   Calcium 9.1 8.7 - 10.2 mg/dL  Hepatic function panel  Result Value Ref Range   Total Protein 7.2 6.0 - 8.5 g/dL   Albumin 4.6 3.5 - 5.5 g/dL   Bilirubin Total 0.5 0.0 - 1.2 mg/dL   Bilirubin, Direct 0.14 0.00 - 0.40 mg/dL   Alkaline Phosphatase 77 39 - 117 IU/L   AST 18 0 - 40 IU/L   ALT 14 0 - 44 IU/L  PSA  Result Value Ref Range   Prostate Specific Ag, Serum 0.3 0.0 - 4.0 ng/mL  Lipid panel  Result Value Ref Range   Cholesterol, Total 207 (H) 100 - 199 mg/dL   Triglycerides 98 0 - 149 mg/dL   HDL 42 >39 mg/dL   VLDL Cholesterol Cal 20 5 - 40 mg/dL   LDL Calculated 145 (H) 0 - 99 mg/dL   Chol/HDL Ratio 4.9 0.0 - 5.0 ratio   Do labs by end of year

## 2017-11-18 NOTE — Progress Notes (Signed)
   Subjective:    Patient ID: Wayne [name], male    DOB: January 20, 1961, 57 y.o.   MRN: [id number]  HPI The patient comes in today for a wellness visit.  Patient was recently seen Has a cyst on his back referred to dermatology He also stopped taking his cholesterol medicine because his wife encouraged him to stop the medicine   A review of their health history was completed.  A review of medications was also completed.  Any needed refills; none today  Eating habits: health conscious  Falls/  MVA accidents in past few months: none  Regular exercise: none  Specialist pt sees on regular basis: none  Preventative health issues were discussed.   Additional concerns: abdominal pain and diarrhea after eating chicken twice this month.   Flu vaccine today.     Review of Systems  Constitutional: Negative for diaphoresis and fatigue.  HENT: Negative for congestion and rhinorrhea.   Respiratory: Negative for cough and shortness of breath.   Cardiovascular: Negative for chest pain and leg swelling.  Gastrointestinal: Negative for abdominal pain and diarrhea.  Skin: Negative for color change and rash.  Neurological: Negative for dizziness and headaches.  Psychiatric/Behavioral: Negative for behavioral problems and confusion.       Objective:   Physical Exam  Constitutional: He appears well-nourished. No distress.  HENT:  Head: Normocephalic and atraumatic.  Eyes: Right eye exhibits no discharge. Left eye exhibits no discharge.  Neck: No tracheal deviation present.  Cardiovascular: Normal rate, regular rhythm and normal heart sounds.  No murmur heard. Pulmonary/Chest: Effort normal and breath sounds normal. No respiratory distress.  Musculoskeletal: He exhibits no edema.  Lymphadenopathy:    He has no cervical adenopathy.  Neurological: He is alert. Coordination normal.  Skin: Skin is warm and dry.  Psychiatric: He has a normal mood and affect. His behavior is normal.  Vitals  reviewed.  Up-to-date on colonoscopy Prostate exam normal      Assessment & Plan:  Patient agrees to reinitiate cholesterol medicine he will do lab work again within 10 to 12 weeks patient will follow-up in springtime  Depression doing well on current medication continue current medicine  Adult wellness-complete.wellness physical was conducted today. Importance of diet and exercise were discussed in detail.  In addition to this a discussion regarding safety was also covered. We also reviewed over immunizations and gave recommendations regarding current immunization needed for age.  In addition to this additional areas were also touched on including: Preventative health exams needed:  Colonoscopy up-to-date currently  Patient was advised yearly wellness exam His risk for heart disease is 7.7% risk is not acceptable we talked about many different options he chooses to go back on cholesterol medicine he will repeat lab work again in approximately 10 to 12 weeks he will follow-up by spring time

## 2018-04-20 ENCOUNTER — Ambulatory Visit (INDEPENDENT_AMBULATORY_CARE_PROVIDER_SITE_OTHER): Admitting: Family Medicine

## 2018-04-20 ENCOUNTER — Encounter: Payer: Self-pay | Admitting: Family Medicine

## 2018-04-20 VITALS — BP 110/82 | Temp 98.2°F | Ht 66.0 in | Wt 198.6 lb

## 2018-04-20 DIAGNOSIS — J019 Acute sinusitis, unspecified: Secondary | ICD-10-CM

## 2018-04-20 MED ORDER — AMOXICILLIN-POT CLAVULANATE 875-125 MG PO TABS: 1.0000 | ORAL_TABLET | Freq: Two times a day (BID) | ORAL | 0 refills | Status: DC

## 2018-04-20 NOTE — Progress Notes (Signed)
   Subjective:    Patient ID: Wayne [name], male    DOB: January 31, 1961, 58 y.o.   MRN: [id number]  Cough  This is a new problem. The current episode started in the past 7 days. Associated symptoms include nasal congestion, rhinorrhea and a sore throat. Pertinent negatives include no chest pain, chills, ear pain, fever or wheezing. He has tried OTC cough suppressant for the symptoms.  Significant head congestion drainage coughing denies wheezing difficulty breathing    Review of Systems  Constitutional: Negative for activity change, chills and fever.  HENT: Positive for congestion, rhinorrhea and sore throat. Negative for ear pain.   Eyes: Negative for discharge.  Respiratory: Positive for cough. Negative for wheezing.   Cardiovascular: Negative for chest pain.  Gastrointestinal: Negative for nausea and vomiting.  Musculoskeletal: Negative for arthralgias.       Objective:   Physical Exam Vitals signs and nursing note reviewed.  Constitutional:      Appearance: He is well-developed.  HENT:     Head: Normocephalic.     Mouth/Throat:     Pharynx: No oropharyngeal exudate.  Neck:     Musculoskeletal: Normal range of motion.  Cardiovascular:     Rate and Rhythm: Normal rate and regular rhythm.     Heart sounds: Normal heart sounds. No murmur.  Pulmonary:     Effort: Pulmonary effort is normal.     Breath sounds: Normal breath sounds. No wheezing.  Lymphadenopathy:     Cervical: No cervical adenopathy.  Skin:    General: Skin is warm and dry.  Neurological:     Motor: No abnormal muscle tone.           Assessment & Plan:  Patient was seen today for upper respiratory illness. It is felt that the patient is dealing with sinusitis.  Antibiotics were prescribed today. Importance of compliance with medication was discussed.  Symptoms should gradually resolve over the course of the next several days. If high fevers, progressive illness, difficulty breathing, worsening condition  or failure for symptoms to improve over the next several days then the patient is to follow-up.  If any emergent conditions the patient is to follow-up in the emergency department otherwise to follow-up in the office.

## 2018-05-19 ENCOUNTER — Ambulatory Visit: Admitting: Family Medicine

## 2019-02-23 ENCOUNTER — Other Ambulatory Visit: Payer: Self-pay | Admitting: Family Medicine

## 2019-02-23 NOTE — Telephone Encounter (Signed)
May have 1 refill needs virtual visit

## 2019-02-24 NOTE — Telephone Encounter (Signed)
Patient scheduled appointment for 6 month follow up on medication for 2/2.

## 2019-03-16 ENCOUNTER — Ambulatory Visit (INDEPENDENT_AMBULATORY_CARE_PROVIDER_SITE_OTHER): Admitting: Family Medicine

## 2019-03-16 ENCOUNTER — Other Ambulatory Visit: Payer: Self-pay

## 2019-03-16 DIAGNOSIS — F411 Generalized anxiety disorder: Secondary | ICD-10-CM | POA: Diagnosis not present

## 2019-03-16 DIAGNOSIS — E7849 Other hyperlipidemia: Secondary | ICD-10-CM

## 2019-03-16 MED ORDER — PAROXETINE HCL 40 MG PO TABS: 40.0000 mg | ORAL_TABLET | Freq: Every morning | ORAL | 1 refills | Status: AC

## 2019-03-16 NOTE — Progress Notes (Signed)
   Subjective:    Patient ID: Wayne [name], male    DOB: [birth year], 59 y.o.   MRN: [id number]  HPI Pt received refill of Paroxetine 40 mg on January 15; he was told he needed to have visit for further refills. Pt states his script is lost in the mail and is needing a new one sent to [facility]. Pt states he is doing ok; no issues or concerns. Patient does take his medicine regular basis denies being depressed.  States it helps him get along and feel better about things.  States he would like to have refills sent in He states he is also using PPI intermittently but not daily he has also taken his cholesterol medicine and watching his diet. Pt will receive COVID shot on 03/21/19 at [facility]   Virtual Visit via Telephone Note  I connected with [name] on 03/16/19 at 10:00 AM EST by telephone and verified that I am speaking with the correct person using two identifiers.  Location: Patient: home Provider: office   I discussed the limitations, risks, security and privacy concerns of performing an evaluation and management service by telephone and the availability of in person appointments. I also discussed with the patient that there may be a patient responsible charge related to this service. The patient expressed understanding and agreed to proceed.   History of Present Illness:    Observations/Objective:   Assessment and Plan:   Follow Up Instructions:    I discussed the assessment and treatment plan with the patient. The patient was provided an opportunity to ask questions and all were answered. The patient agreed with the plan and demonstrated an understanding of the instructions.   The patient was advised to call back or seek an in-person evaluation if the symptoms worsen or if the condition fails to improve as anticipated.  I provided 16 minutes of non-face-to-face time during this encounter.      Review of Systems  Constitutional: Negative for diaphoresis and  fatigue.  HENT: Negative for congestion and rhinorrhea.   Respiratory: Negative for cough and shortness of breath.   Cardiovascular: Negative for chest pain and leg swelling.  Gastrointestinal: Negative for abdominal pain and diarrhea.  Skin: Negative for color change and rash.  Neurological: Negative for dizziness and headaches.  Psychiatric/Behavioral: Negative for behavioral problems and confusion.       Objective:   Physical Exam    Today's visit was via telephone Physical exam was not possible for this visit      Assessment & Plan:  GAD/anxiety overall doing well with medication he would like to continue it.  Refills were sent in Follow-up in 6 months at that time recommend a wellness exam and lab work patient states he will do so

## 2019-08-06 ENCOUNTER — Other Ambulatory Visit: Payer: Self-pay | Admitting: *Deleted

## 2019-08-06 MED ORDER — PANTOPRAZOLE SODIUM 40 MG PO TBEC: 40.0000 mg | DELAYED_RELEASE_TABLET | Freq: Every day | ORAL | 0 refills | Status: AC

## 2019-11-01 ENCOUNTER — Ambulatory Visit: Admitting: Family Medicine

## 2019-11-01 ENCOUNTER — Other Ambulatory Visit: Payer: Self-pay

## 2019-11-01 ENCOUNTER — Encounter: Payer: Self-pay | Admitting: Family Medicine

## 2019-11-01 ENCOUNTER — Ambulatory Visit (INDEPENDENT_AMBULATORY_CARE_PROVIDER_SITE_OTHER): Admitting: Family Medicine

## 2019-11-01 ENCOUNTER — Telehealth: Payer: Self-pay | Admitting: Family Medicine

## 2019-11-01 VITALS — BP 110/70 | HR 80 | Temp 97.7°F | Ht 66.0 in | Wt 196.0 lb

## 2019-11-01 DIAGNOSIS — Z23 Encounter for immunization: Secondary | ICD-10-CM | POA: Diagnosis not present

## 2019-11-01 DIAGNOSIS — L0291 Cutaneous abscess, unspecified: Secondary | ICD-10-CM | POA: Diagnosis not present

## 2019-11-01 MED ORDER — TRIAMCINOLONE ACETONIDE 0.1 % EX CREA: 1.0000 "application " | TOPICAL_CREAM | Freq: Two times a day (BID) | CUTANEOUS | 4 refills | Status: AC

## 2019-11-01 MED ORDER — DOXYCYCLINE HYCLATE 100 MG PO TABS: 100.0000 mg | ORAL_TABLET | Freq: Two times a day (BID) | ORAL | 0 refills | Status: DC

## 2019-11-01 NOTE — Telephone Encounter (Signed)
Patient was seen this afternoon and went by drug store to pick up medications but only his antibiotic was called in and suppose to get steroid cream also. [facility]

## 2019-11-01 NOTE — Progress Notes (Signed)
   Subjective:    Patient ID: Wayne [name], male    DOB: 08-16-1960, 59 y.o.   MRN: [id number]  HPIabscess on right side of head. Noticed it about 6 days ago.  Having pain and drainage. Tried warm compresses.   Dry skin on hands. Thought it was from washing dishes. Started using rubber gloves. Tried washing in castile soap and vinegar.   Would like to get a flu vaccine today.  Patient with an abscess on the right side of his head.  Tender painful draining some pus.  Patient is interested in having this drained.  Denies fever or chills currently   Review of Systems    See above Objective:   Physical Exam Abscess noted on the right side of his head in the scalp region.  No other masses are felt neck no masses patient not toxic dyshidrotic eczema on the hands  Abscess was numbed with 1% lidocaine with epinephrine 11.  Blade was used to drain this area Somewhat painful for the patient but he tolerated it well as best as could be expected warning signs what to watch for were discussed recommend warm compresses follow-up if ongoing troubles     Assessment & Plan:  Dyshidrotic eczema recommend treatment with triamcinolone cream twice daily as needed  Doxycycline twice daily for the next 10 days follow-up if progressive troubles or worse warning signs discussed in detail warm compresses on a frequent basis

## 2019-11-01 NOTE — Telephone Encounter (Signed)
Pt.notified

## 2019-11-01 NOTE — Telephone Encounter (Signed)
Please send in triamcinolone cream 0.1% apply twice daily as needed to rash 45 g 1 refill

## 2019-11-29 ENCOUNTER — Telehealth: Payer: Self-pay

## 2019-11-29 NOTE — Telephone Encounter (Signed)
Patient is still having issues with left knee pain and swelling. He is out of town taking care of his dad and he doesn't know when he will be back. He is wanting to do virtual visit or should he go to Urgent Care where he is.please advise

## 2019-11-30 NOTE — Telephone Encounter (Signed)
Please schedule pt for video visit. Thank you!

## 2019-12-11 ENCOUNTER — Encounter: Payer: Self-pay | Admitting: Family Medicine

## 2019-12-13 ENCOUNTER — Other Ambulatory Visit: Payer: Self-pay | Admitting: *Deleted

## 2019-12-13 MED ORDER — MOMETASONE FUROATE 0.1 % EX CREA: 1.0000 "application " | TOPICAL_CREAM | Freq: Two times a day (BID) | CUTANEOUS | 1 refills | Status: DC

## 2019-12-13 NOTE — Telephone Encounter (Signed)
Nurses I would recommend Elocon cream 0.1%, 45 g, apply twice daily as needed, 1 refill  If this does not significantly help his issue Monica Martinez should let us know the next step would be dermatology consultation  Thanks-Dr. Nicki Reaper

## 2020-04-13 ENCOUNTER — Ambulatory Visit (INDEPENDENT_AMBULATORY_CARE_PROVIDER_SITE_OTHER): Admitting: Family Medicine

## 2020-04-13 ENCOUNTER — Other Ambulatory Visit: Payer: Self-pay

## 2020-04-13 DIAGNOSIS — J019 Acute sinusitis, unspecified: Secondary | ICD-10-CM

## 2020-04-13 DIAGNOSIS — J029 Acute pharyngitis, unspecified: Secondary | ICD-10-CM | POA: Diagnosis not present

## 2020-04-13 MED ORDER — AMOXICILLIN 500 MG PO TABS: 500.0000 mg | ORAL_TABLET | Freq: Three times a day (TID) | ORAL | 0 refills | Status: AC

## 2020-04-13 NOTE — Progress Notes (Signed)
   Subjective:    Patient ID: Hulen Skains, male    DOB: 1960-11-04, 60 y.o.   MRN: [id number]  HPI  Patient presents today with respiratory illness Number of days present- 3 days  Symptoms include- stuffy nose, sore throat, sneezing.   Presence of worrisome signs (severe shortness of breath, lethargy, etc.) - none  Recent/current visit to urgent care or ER- none  Recent direct exposure to Covid- none  Any current Covid testing- none  Patient with a lot of head congestion drainage coughing denies high fever chills sweats he has had his vaccination  Review of Systems Please see above    Objective:   Physical Exam  Nares crusted lungs are clear respiratory rate normal heart regular Patient also has sebaceous cyst on his back     Assessment & Plan:  Patient states he will go to the [place] for his dermatology needs  Acute rhinosinusitis antibiotic prescribed warning signs's Covid test taken Home isolation for couple days until test results are back warning signs discussed follow-up if problems

## 2020-04-14 LAB — SARS-COV-2, NAA 2 DAY TAT

## 2020-04-14 LAB — SPECIMEN STATUS REPORT

## 2020-04-14 LAB — NOVEL CORONAVIRUS, NAA: SARS-CoV-2, NAA: NOT DETECTED

## 2022-02-26 ENCOUNTER — Encounter (INDEPENDENT_AMBULATORY_CARE_PROVIDER_SITE_OTHER): Payer: Self-pay | Admitting: *Deleted
